# Patient Record
Sex: Female | Born: 1965 | Race: White | Hispanic: No | Marital: Single | Smoking: Never smoker
Health system: Southern US, Community
[De-identification: ages and names within clinical notes are randomized; demographics above are authoritative.]

## PROBLEM LIST (undated history)

## (undated) DIAGNOSIS — N189 Chronic kidney disease, unspecified: Secondary | ICD-10-CM

## (undated) DIAGNOSIS — M3481 Systemic sclerosis with lung involvement: Secondary | ICD-10-CM

## (undated) DIAGNOSIS — I279 Pulmonary heart disease, unspecified: Secondary | ICD-10-CM

## (undated) DIAGNOSIS — F429 Obsessive-compulsive disorder, unspecified: Secondary | ICD-10-CM

## (undated) DIAGNOSIS — N179 Acute kidney failure, unspecified: Secondary | ICD-10-CM

## (undated) DIAGNOSIS — M349 Systemic sclerosis, unspecified: Secondary | ICD-10-CM

## (undated) DIAGNOSIS — M810 Age-related osteoporosis without current pathological fracture: Secondary | ICD-10-CM

## (undated) DIAGNOSIS — I7300 Raynaud's syndrome without gangrene: Secondary | ICD-10-CM

## (undated) DIAGNOSIS — K224 Dyskinesia of esophagus: Secondary | ICD-10-CM

## (undated) HISTORY — DX: Raynaud's syndrome without gangrene: I73.00

## (undated) HISTORY — DX: Age-related osteoporosis without current pathological fracture: M81.0

## (undated) HISTORY — DX: Obsessive-compulsive disorder, unspecified: F42.9

## (undated) HISTORY — DX: Systemic sclerosis, unspecified: M34.9

## (undated) HISTORY — DX: Chronic kidney disease, unspecified: N18.9

## (undated) HISTORY — DX: Pulmonary heart disease, unspecified: I27.9

## (undated) HISTORY — DX: Dyskinesia of esophagus: K22.4

## (undated) HISTORY — DX: Systemic sclerosis with lung involvement: M34.81

## (undated) HISTORY — DX: Acute kidney failure, unspecified: N17.9

---

## 2009-01-12 ENCOUNTER — Ambulatory Visit: Payer: Self-pay | Admitting: Internal Medicine

## 2009-07-04 ENCOUNTER — Ambulatory Visit: Payer: Self-pay | Admitting: Internal Medicine

## 2013-01-22 ENCOUNTER — Ambulatory Visit: Payer: Self-pay | Admitting: Internal Medicine

## 2013-05-10 ENCOUNTER — Ambulatory Visit: Payer: Self-pay | Admitting: Family Medicine

## 2013-05-10 LAB — RAPID STREP-A WITH REFLX: Micro Text Report: NEGATIVE

## 2013-05-12 LAB — BETA STREP CULTURE(ARMC)

## 2014-01-21 ENCOUNTER — Ambulatory Visit: Payer: Self-pay | Admitting: Internal Medicine

## 2014-04-05 DIAGNOSIS — R918 Other nonspecific abnormal finding of lung field: Secondary | ICD-10-CM | POA: Insufficient documentation

## 2014-08-20 ENCOUNTER — Other Ambulatory Visit: Payer: Self-pay | Admitting: Internal Medicine

## 2014-08-20 ENCOUNTER — Ambulatory Visit: Payer: 59 | Attending: Internal Medicine

## 2014-08-20 ENCOUNTER — Other Ambulatory Visit: Payer: 59 | Admitting: Internal Medicine

## 2014-08-20 DIAGNOSIS — J8417 Other interstitial pulmonary diseases with fibrosis in diseases classified elsewhere: Secondary | ICD-10-CM | POA: Diagnosis not present

## 2014-08-20 DIAGNOSIS — J8410 Pulmonary fibrosis, unspecified: Secondary | ICD-10-CM

## 2014-10-31 DIAGNOSIS — Z0000 Encounter for general adult medical examination without abnormal findings: Secondary | ICD-10-CM | POA: Insufficient documentation

## 2014-11-03 DIAGNOSIS — Z789 Other specified health status: Secondary | ICD-10-CM | POA: Insufficient documentation

## 2014-11-03 DIAGNOSIS — F1090 Alcohol use, unspecified, uncomplicated: Secondary | ICD-10-CM | POA: Insufficient documentation

## 2015-11-12 DIAGNOSIS — M810 Age-related osteoporosis without current pathological fracture: Secondary | ICD-10-CM | POA: Insufficient documentation

## 2016-08-24 DIAGNOSIS — F422 Mixed obsessional thoughts and acts: Secondary | ICD-10-CM | POA: Insufficient documentation

## 2016-08-24 DIAGNOSIS — R002 Palpitations: Secondary | ICD-10-CM | POA: Insufficient documentation

## 2016-11-24 DIAGNOSIS — I2721 Secondary pulmonary arterial hypertension: Secondary | ICD-10-CM | POA: Insufficient documentation

## 2018-02-21 ENCOUNTER — Encounter: Payer: Self-pay | Admitting: Psychiatry

## 2018-02-21 ENCOUNTER — Ambulatory Visit (INDEPENDENT_AMBULATORY_CARE_PROVIDER_SITE_OTHER): Payer: 59 | Admitting: Psychiatry

## 2018-02-21 ENCOUNTER — Other Ambulatory Visit: Payer: Self-pay

## 2018-02-21 VITALS — BP 102/69 | HR 97 | Temp 97.4°F | Ht 63.19 in | Wt 166.4 lb

## 2018-02-21 DIAGNOSIS — F429 Obsessive-compulsive disorder, unspecified: Secondary | ICD-10-CM | POA: Diagnosis not present

## 2018-02-21 DIAGNOSIS — F330 Major depressive disorder, recurrent, mild: Secondary | ICD-10-CM

## 2018-02-21 DIAGNOSIS — F410 Panic disorder [episodic paroxysmal anxiety] without agoraphobia: Secondary | ICD-10-CM

## 2018-02-21 DIAGNOSIS — F1020 Alcohol dependence, uncomplicated: Secondary | ICD-10-CM

## 2018-02-21 DIAGNOSIS — M340 Progressive systemic sclerosis: Secondary | ICD-10-CM | POA: Insufficient documentation

## 2018-02-21 DIAGNOSIS — I159 Secondary hypertension, unspecified: Secondary | ICD-10-CM | POA: Insufficient documentation

## 2018-02-21 MED ORDER — VENLAFAXINE HCL ER 37.5 MG PO CP24: 37.5000 mg | ORAL_CAPSULE | Freq: Every day | ORAL | 1 refills | Status: DC

## 2018-02-21 MED ORDER — GABAPENTIN 100 MG PO CAPS: 200.0000 mg | ORAL_CAPSULE | Freq: Every day | ORAL | 0 refills | Status: DC

## 2018-02-21 NOTE — Progress Notes (Signed)
Psychiatric Initial Adult Assessment   Patient Identification: Janice Mayo MRN:  [id number] Date of Evaluation:  02/21/2018 Referral Source:Mark Heffington MD Chief Complaint:  ' I am here to establish care.' Chief Complaint    Establish Care; Anxiety; Depression; Panic Attack; Alcohol Problem     Visit Diagnosis:    ICD-10-CM   1. Obsessive-compulsive disorder with good or fair insight F42.9 venlafaxine XR (EFFEXOR-XR) 37.5 MG 24 hr capsule  2. Panic disorder F41.0   3. MDD (major depressive disorder), recurrent episode, mild (HCC) F33.0   4. Alcohol use disorder, moderate, dependence (HCC) F10.20     History of Present Illness:  Janice FailWendy is a 52 year old Caucasian female, single, on FMLA, lives in [place], has a history of OCD, MDD, panic attacks, alcohol use disorder, scleroderma, presented to the clinic today to establish care.  Patient reports she has been struggling with depression since the past several years.  Patient describes her depressive symptoms as sadness, lack of motivation, anhedonia, crying spells, sleep problems and so on.  Patient reports that her depressive symptoms are currently more so under control since she is on venlafaxine.  Venlafaxine was started a few weeks ago by her primary provider.  Patient reports a history of OCD.  She reports she has checking behaviors.  She reports her checking behavior used to be significant in the past.  She would spend hours checking her stove , water faucets, locks and so on.  Patient reports she used to be on Prozac in the past.  She was on Prozac since 2001 however most recently it was discontinued and she was started on venlafaxine.  Patient reports it has helped her and her symptoms are currently improving.  Patient reports she was also struggling with panic attacks on a regular basis.  Patient reports  panic attacks where significant to the point that she was having racing heart rate, chest pain, shortness of breath.  She  reports she was also having trouble driving since she would have panic attacks while driving.  Patient reports her provider recently made a change to venlafaxine and also gave her Klonopin which has helped her.  Patient reports she also struggles with social anxiety on a regular basis.  She likes to stay away from crowds and public places.  Patient reports she has always been that way and was known as a shy child and did not have a lot of friends.  Patient reports she currently sees a psychotherapist-Dr. Marcell AngerJudy Butler who is providing her CBT.  She sees her every week.  Patient struggles with alcoholism.  She reports she used to drink heavily in the past.  She reports she would drink 1/5 of liquor every day in the past.  She reports she is trying to cut down and since the past few weeks has been drinking up to 4 shots per day.  She reports she would like to cut down more however is worried about her having withdrawal symptoms like tremors, DTs, seizures and so on.  Patient reports she never had withdrawal symptoms in the past but she has been doing some reading about alcoholism and that worries her.  Patient reports she is interested in going to an outpatient program to get help with her alcoholism.  Patient reports she struggles with scleroderma.  Patient reports she was initially diagnosed in 2006.  At that time she went into renal failure and that is when she was diagnosed with scleroderma.  Patient reports she currently follows up with her  providers and is on medications for the same.  Patient reports she is currently on FMLA from her work since she was having a lot of stress from her mental health as well as medical problems.  Her provider hence took her off of work and she has 3 more weeks available.    Associated Signs/Symptoms: Depression Symptoms:  depressed mood, insomnia, difficulty concentrating, anxiety, panic attacks, decreased appetite,- reports she is making progress (Hypo) Manic  Symptoms:  denies Anxiety Symptoms:  Panic Symptoms, Obsessive Compulsive Symptoms:   Checking,, Social Anxiety, Psychotic Symptoms:  denies PTSD Symptoms: Negative  Past Psychiatric History: Patient denies any inpatient mental health admissions in the past.  Patient denies any suicide attempts.  Patient reports a previous diagnosis of depression, panic attacks, OCD.  She was being treated by her primary medical doctor.  Patient also sees a therapist -Dr. Marcell Anger in [place].  Previous Psychotropic Medications: Yes Prozac, Klonopin, Ativan, venlafaxine  Substance Abuse History in the last 12 months:  Yes.  Patient has been drinking alcohol since the past 10 years or more.  She reports she was drinking up to 1/5 of liquor every day.  Patient currently trying to cut down and reports she has been drinking up to 4 shots of liquor per day.  Patient is interested in intensive outpatient program.  Patient denies being in residential treatment facilities in the past  Consequences of Substance Abuse: Negative  Past Medical History:  Past Medical History:  Diagnosis Date  . Acute renal failure syndrome (HCC)   . Chronic kidney disease   . Chronic pulmonary heart disease (HCC)   . Diffuse spasm of esophagus   . Lung disease with systemic sclerosis (HCC)   . Obsessive-compulsive disorder   . Osteoporosis   . Raynaud's disease   . Systemic scleroses (HCC)    History reviewed. No pertinent surgical history.  Family Psychiatric History: Brother-anxiety, sister-panic attacks.  Family History:  Family History  Problem Relation Age of Onset  . Anxiety disorder Sister   . Anxiety disorder Brother     Social History:   Social History   Socioeconomic History  . Marital status: Single    Spouse name: Not on file  . Number of children: 0  . Years of education: Not on file  . Highest education level: High school graduate  Occupational History  . Not on file  Social Needs  . Financial  resource strain: Not hard at all  . Food insecurity:    Worry: Never true    Inability: Never true  . Transportation needs:    Medical: No    Non-medical: No  Tobacco Use  . Smoking status: Never Smoker  . Smokeless tobacco: Never Used  Substance and Sexual Activity  . Alcohol use: Yes    Alcohol/week: 20.0 standard drinks    Types: 20 Shots of liquor per week  . Drug use: Not Currently  . Sexual activity: Not Currently  Lifestyle  . Physical activity:    Days per week: 0 days    Minutes per session: 0 min  . Stress: Not on file  Relationships  . Social connections:    Talks on phone: Not on file    Gets together: Not on file    Attends religious service: Never    Active member of club or organization: Yes    Attends meetings of clubs or organizations: More than 4 times per year    Relationship status: Never married  Other Topics Concern  .  Not on file  Social History Narrative  . Not on file    Additional Social History: Patient is single.  She reports she had a normal childhood.  She currently lives by herself in [place].  Patient is currently on FMLA from her work.  Allergies:   Allergies  Allergen Reactions  . Sulfasalazine Hives  . Erythromycin Hives  . Hydroxychloroquine Hives  . Septra [Sulfamethoxazole-Trimethoprim] Hives  . Sulfa Antibiotics Hives  . Tetracyclines & Related Hives  . Trimethoprim Other (See Comments)  . Simvastatin Rash    Metabolic Disorder Labs: No results found for: HGBA1C, MPG No results found for: PROLACTIN No results found for: CHOL, TRIG, HDL, CHOLHDL, VLDL, LDLCALC No results found for: TSH  Therapeutic Level Labs: No results found for: LITHIUM No results found for: CBMZ No results found for: VALPROATE  Current Medications: Current Outpatient Medications  Medication Sig Dispense Refill  . captopril (CAPOTEN) 100 MG tablet Take by mouth.    . Cholecalciferol (VITAMIN D-1000 MAX ST) 25 MCG (1000 UT) tablet Take by mouth.     . clonazePAM (KLONOPIN) 1 MG tablet take 1 Tablet by oral route every 8 hours    . denosumab (PROLIA) 60 MG/ML SOSY injection every 6 (six) months    . Multiple Vitamins-Minerals (CENTRUM FLAVOR BURST ADULT) CHEW Chew by mouth.    . mycophenolate (CELLCEPT) 500 MG tablet Take by mouth.    Marland Kitchen NIFEdipine (PROCARDIA XL/NIFEDICAL XL) 60 MG 24 hr tablet Take by mouth.    Marland Kitchen omeprazole (PRILOSEC) 20 MG capsule Take by mouth.    . Venlafaxine HCl 150 MG TB24 Take by mouth.    . gabapentin (NEURONTIN) 100 MG capsule Take 2 capsules (200 mg total) by mouth at bedtime. 60 capsule 0  . venlafaxine XR (EFFEXOR-XR) 37.5 MG 24 hr capsule Take 1 capsule (37.5 mg total) by mouth daily with breakfast. To be combined to 150 mg 30 capsule 1   No current facility-administered medications for this visit.     Musculoskeletal: Strength & Muscle Tone: within normal limits Gait & Station: normal Patient leans: N/A  Psychiatric Specialty Exam: Review of Systems  Psychiatric/Behavioral: Positive for depression. The patient is nervous/anxious and has insomnia.   All other systems reviewed and are negative.   Blood pressure 102/69, pulse 97, temperature (!) 97.4 F (36.3 C), temperature source Oral, height 5' 3.19" (1.605 m), weight 166 lb 6.4 oz (75.5 kg).Body mass index is 29.3 kg/m.  General Appearance: Casual  Eye Contact:  Fair  Speech:  Clear and Coherent  Volume:  Normal  Mood:  Anxious and Depressed  Affect:  Congruent  Thought Process:  Goal Directed and Descriptions of Associations: Intact  Orientation:  Full (Time, Place, and Person)  Thought Content:  Logical  Suicidal Thoughts:  No  Homicidal Thoughts:  No  Memory:  Immediate;   Fair Recent;   Fair Remote;   Fair  Judgement:  Fair  Insight:  Fair  Psychomotor Activity:  Normal  Concentration:  Concentration: Fair and Attention Span: Fair  Recall:  [employer] of Knowledge:Fair  Language: Fair  Akathisia:  No  Handed:  Right  AIMS (if  indicated):0  Assets:  Communication Skills Desire for Improvement Housing Social Support Talents/Skills Transportation Vocational/Educational  ADL's:  Intact  Cognition: WNL  Sleep:  Poor   Screenings:   Assessment and Plan: Arlyne is a 52 year old Caucasian female, single, has a history of OCD, panic attacks, MDD, social anxiety disorder, alcohol use disorder, scleroderma,  pulmonary hypertension, presented to the clinic today to establish care.  Patient is biologically predisposed given her alcoholism as well as family history of mental health problems and her medical problems.  Patient also has psychosocial stressors of her chronic medical illness.  Patient denies any suicidality.  She is motivated to stay on medications as well as pursue psychotherapy.  Plan as noted below.  Plan OCD Increase venlafaxine to 187.5 mg p.o. Daily Continue Klonopin-however discussed with her to take 0.5 mg in the morning and 0.5 mg a day.  She was taking 1.5 daily in divided dosage. I have reviewed [place] controlled substance database.  Discussed with patient the risk of being on long-term benzodiazepine therapy as well as the risk of mixing it with alcohol.  Patient reports she is trying to cut down on her drinking.  For MDD Increase venlafaxine to 187.5 mg p.o. daily Continue CBT with Dr. Marcell Anger in [place].  For panic attacks Venlafaxine as prescribed Continue CBT Continue Klonopin as prescribed.  For alcohol use disorder Will refer patient to [facility] program in [place].  Also provided patient with information for other substance abuse treatment programs like [facility], [facility], [facility] and so on. We will start gabapentin 200 mg p.o. Nightly.  I have reviewed TSH-in [facility]-the normal limits on 08/24/2016.  Follow-up in clinic in 1 week or sooner if needed.  More than 50 % of the time was spent for psychoeducation and supportive psychotherapy and care coordination.  This  note was generated in part or whole with voice recognition software. Voice recognition is usually quite accurate but there are transcription errors that can and very often do occur. I apologize for any typographical errors that were not detected and corrected.            Jomarie Longs, MD 11/20/20194:20 PM

## 2018-02-21 NOTE — Patient Instructions (Signed)
Please start taking Half of 1 mg ( that is 0.5 mg)  of Klonopin daily in the AM and continue your mid day dosage as it is until you return.

## 2018-02-23 ENCOUNTER — Telehealth (HOSPITAL_COMMUNITY): Payer: Self-pay | Admitting: Psychology

## 2018-02-23 ENCOUNTER — Telehealth: Payer: Self-pay | Admitting: Psychiatry

## 2018-02-23 NOTE — Telephone Encounter (Signed)
Called Ms.Charmian MuffAnn Evans with [facility] for update on patient referral. She will call her today and will ask her to come in Monday for IOP.

## 2018-02-27 ENCOUNTER — Telehealth (HOSPITAL_COMMUNITY): Payer: Self-pay | Admitting: Psychology

## 2018-02-28 ENCOUNTER — Other Ambulatory Visit: Payer: Self-pay

## 2018-02-28 ENCOUNTER — Ambulatory Visit (INDEPENDENT_AMBULATORY_CARE_PROVIDER_SITE_OTHER): Payer: 59 | Admitting: Psychiatry

## 2018-02-28 ENCOUNTER — Encounter: Payer: Self-pay | Admitting: Psychiatry

## 2018-02-28 VITALS — BP 115/79 | HR 122 | Temp 98.7°F | Wt 165.4 lb

## 2018-02-28 DIAGNOSIS — N179 Acute kidney failure, unspecified: Secondary | ICD-10-CM | POA: Insufficient documentation

## 2018-02-28 DIAGNOSIS — Z1231 Encounter for screening mammogram for malignant neoplasm of breast: Secondary | ICD-10-CM | POA: Insufficient documentation

## 2018-02-28 DIAGNOSIS — F3289 Other specified depressive episodes: Secondary | ICD-10-CM | POA: Insufficient documentation

## 2018-02-28 DIAGNOSIS — F1020 Alcohol dependence, uncomplicated: Secondary | ICD-10-CM

## 2018-02-28 DIAGNOSIS — R091 Pleurisy: Secondary | ICD-10-CM | POA: Insufficient documentation

## 2018-02-28 DIAGNOSIS — Z79899 Other long term (current) drug therapy: Secondary | ICD-10-CM | POA: Insufficient documentation

## 2018-02-28 DIAGNOSIS — I2789 Other specified pulmonary heart diseases: Secondary | ICD-10-CM | POA: Insufficient documentation

## 2018-02-28 DIAGNOSIS — E038 Other specified hypothyroidism: Secondary | ICD-10-CM | POA: Insufficient documentation

## 2018-02-28 DIAGNOSIS — K224 Dyskinesia of esophagus: Secondary | ICD-10-CM | POA: Insufficient documentation

## 2018-02-28 DIAGNOSIS — F330 Major depressive disorder, recurrent, mild: Secondary | ICD-10-CM

## 2018-02-28 DIAGNOSIS — F410 Panic disorder [episodic paroxysmal anxiety] without agoraphobia: Secondary | ICD-10-CM | POA: Diagnosis not present

## 2018-02-28 DIAGNOSIS — F429 Obsessive-compulsive disorder, unspecified: Secondary | ICD-10-CM | POA: Diagnosis not present

## 2018-02-28 DIAGNOSIS — I7300 Raynaud's syndrome without gangrene: Secondary | ICD-10-CM | POA: Insufficient documentation

## 2018-02-28 DIAGNOSIS — I2729 Other secondary pulmonary hypertension: Secondary | ICD-10-CM | POA: Insufficient documentation

## 2018-02-28 DIAGNOSIS — T148XXA Other injury of unspecified body region, initial encounter: Secondary | ICD-10-CM | POA: Insufficient documentation

## 2018-02-28 DIAGNOSIS — J8410 Pulmonary fibrosis, unspecified: Secondary | ICD-10-CM | POA: Insufficient documentation

## 2018-02-28 DIAGNOSIS — M810 Age-related osteoporosis without current pathological fracture: Secondary | ICD-10-CM | POA: Insufficient documentation

## 2018-02-28 MED ORDER — GABAPENTIN 100 MG PO CAPS: 200.0000 mg | ORAL_CAPSULE | Freq: Every day | ORAL | 1 refills | Status: DC

## 2018-02-28 MED ORDER — TRAZODONE HCL 50 MG PO TABS: 25.0000 mg | ORAL_TABLET | Freq: Every day | ORAL | 1 refills | Status: AC

## 2018-02-28 NOTE — Progress Notes (Signed)
[facility] MD  OP Progress Note  02/28/2018 2:27 PM Janice Mayo  MRN:  [id number]  Chief Complaint: ' I am here for follow up." Chief Complaint    Follow-up; Medication Refill     HPI: Janice Mayo is a 52 year old Caucasian female, single on FMLA, lives in [place], has a history of OCD, MDD, panic attacks, alcohol use disorder, scleroderma, presented to the clinic today for a follow-up visit.  Patient today reports that she has been making some progress with regards to her mood symptoms.  She reports her sadness as improved and she feels more motivated .  She is tolerating the Effexor well at the higher dosage.  Patient reports she has been better able to cope with her obsessions and compulsions.   She continues to cut down on her alcohol use.  She is down to 3 drinks per day now.  She reports she continues to want to cut down.  She was referred to [facility] with [place].  She spoke to Janice Mayo referred her for hospital detox as well as groups.  Patient however did not want to do it due to the distance and did not want to drive that far.  Patient reports she has been going for [employer] which helps.  She wants to continue to do that.  She is taking gabapentin at bedtime and she wants to continue the same.  She does not want any other medication for alcohol use disorder like naltrexone or Campral at this time.  Patient denies any suicidality.  Patient denies any perceptual disturbances.  She reports she does have increased heart rate often due to her scleroderma.  She continues to follow-up with her providers for the same.  Currently in psychotherapy with Janice Mayo which is going well. Visit Diagnosis:    ICD-10-CM   1. Obsessive-compulsive disorder with good or fair insight F42.9 gabapentin (NEURONTIN) 100 MG capsule    traZODone (DESYREL) 50 MG tablet  2. Panic disorder F41.0 gabapentin (NEURONTIN) 100 MG capsule    traZODone (DESYREL) 50 MG tablet  3. MDD (major depressive disorder),  recurrent episode, mild ([place]) F33.0   4. Alcohol use disorder, moderate, dependence (HCC) F10.20 gabapentin (NEURONTIN) 100 MG capsule    Past Psychiatric History: Have reviewed past psychiatric history from my progress note on 02/21/2018.  Past trials of Prozac, Klonopin, Ativan, venlafaxine  Past Medical History:  Past Medical History:  Diagnosis Date  . Acute renal failure syndrome ([place])   . Chronic kidney disease   . Chronic pulmonary heart disease ([place])   . Diffuse spasm of esophagus   . Lung disease with systemic sclerosis ([place])   . Obsessive-compulsive disorder   . Osteoporosis   . Raynaud's disease   . Systemic scleroses ([place])    History reviewed. No pertinent surgical history.  Family Psychiatric History: Reviewed family psychiatric history from my progress note on 02/21/2018  Family History:  Family History  Problem Relation Age of Onset  . Anxiety disorder Sister   . Anxiety disorder Brother    Substance abuse history: Patient has a history of heavy alcohol abuse since the past 10 years or more.  She has been cutting down on her drinks and is currently drinking up to 3 drinks per day.  Patient is not interested in [facility] or detox programs at this time.  She was given the information for the same.  She is currently in [employer].   Social History: I have reviewed social history  from my progress note on 02/21/2018 Social History   Socioeconomic History  . Marital status: Single    Spouse name: Not on file  . Number of children: 0  . Years of education: Not on file  . Highest education level: High school graduate  Occupational History  . Not on file  Social Needs  . Financial resource strain: Not hard at all  . Food insecurity:    Worry: Never true    Inability: Never true  . Transportation needs:    Medical: No    Non-medical: No  Tobacco Use  . Smoking status: Never Smoker  . Smokeless tobacco: Never Used  Substance and Sexual Activity  . Alcohol use:  Yes    Alcohol/week: 20.0 standard drinks    Types: 20 Shots of liquor per week  . Drug use: Not Currently  . Sexual activity: Not Currently  Lifestyle  . Physical activity:    Days per week: 0 days    Minutes per session: 0 min  . Stress: Not on file  Relationships  . Social connections:    Talks on phone: Not on file    Gets together: Not on file    Attends religious service: Never    Active member of club or organization: Yes    Attends meetings of clubs or organizations: More than 4 times per year    Relationship status: Never married  Other Topics Concern  . Not on file  Social History Narrative  . Not on file    Allergies:  Allergies  Allergen Reactions  . Sulfasalazine Hives  . Erythromycin Hives  . Hydroxychloroquine Hives  . Septra [Sulfamethoxazole-Trimethoprim] Hives  . Sulfa Antibiotics Hives  . Tetracyclines & Related Hives  . Trimethoprim Other (See Comments)  . Simvastatin Rash    Metabolic Disorder Labs: No results found for: HGBA1C, MPG No results found for: PROLACTIN No results found for: CHOL, TRIG, HDL, CHOLHDL, VLDL, LDLCALC No results found for: TSH  Therapeutic Level Labs: No results found for: LITHIUM No results found for: VALPROATE No components found for:  CBMZ  Current Medications: Current Outpatient Medications  Medication Sig Dispense Refill  . captopril (CAPOTEN) 100 MG tablet Take by mouth.    . Cholecalciferol (VITAMIN D-1000 MAX ST) 25 MCG (1000 UT) tablet Take by mouth.    . clonazePAM (KLONOPIN) 1 MG tablet take 1 Tablet by oral route every 8 hours    . denosumab (PROLIA) 60 MG/ML SOSY injection every 6 (six) months    . gabapentin (NEURONTIN) 100 MG capsule Take 2 capsules (200 mg total) by mouth at bedtime. 60 capsule 1  . Multiple Vitamins-Minerals (CENTRUM FLAVOR BURST ADULT) CHEW Chew by mouth.    . mycophenolate (CELLCEPT) 500 MG tablet Take by mouth.    Marland Kitchen NIFEdipine (PROCARDIA XL/NIFEDICAL XL) 60 MG 24 hr tablet Take  by mouth.    Marland Kitchen omeprazole (PRILOSEC) 20 MG capsule Take by mouth.    . venlafaxine XR (EFFEXOR-XR) 37.5 MG 24 hr capsule Take 1 capsule (37.5 mg total) by mouth daily with breakfast. To be combined to 150 mg 30 capsule 1  . traZODone (DESYREL) 50 MG tablet Take 0.5-1 tablets (25-50 mg total) by mouth at bedtime. For sleep 30 tablet 1   No current facility-administered medications for this visit.      Musculoskeletal: Strength & Muscle Tone: within normal limits Gait & Station: normal Patient leans: N/A  Psychiatric Specialty Exam: Review of Systems  Psychiatric/Behavioral: The patient is nervous/anxious  and has insomnia.   All other systems reviewed and are negative.   Blood pressure 115/79, pulse (!) 122, temperature 98.7 F (37.1 C), temperature source Oral, weight 165 lb 6.4 oz (75 kg).Body mass index is 29.12 kg/m.  General Appearance: Casual  Eye Contact:  Good  Speech:  Normal Rate  Volume:  Decreased  Mood:  Anxious improving  Affect:  Congruent  Thought Process:  Goal Directed and Descriptions of Associations: Intact  Orientation:  Full (Time, Place, and Person)  Thought Content: Logical   Suicidal Thoughts:  No  Homicidal Thoughts:  No  Memory:  Immediate;   Fair Recent;   Fair Remote;   Fair  Judgement:  Fair  Insight:  Fair  Psychomotor Activity:  Normal  Concentration:  Concentration: Fair and Attention Span: Fair  Recall:  [employer] of Knowledge: Fair  Language: Fair  Akathisia:  No  Handed:  Right  AIMS (if indicated): denies tremors, rigidity,stiffnes  Assets:  Communication Skills Desire for Improvement Social Support  ADL's:  Intact  Cognition: WNL  Sleep:  Poor   Screenings:   Assessment and Plan: Suman is a 52 year old Caucasian female, single, has a history of OCD, panic attacks, MDD, social anxiety disorder, alcohol use disorder, scleroderma, pulmonary hypertension, presented to the clinic today for a follow-up visit.  Patient is  biologically predisposed given her alcoholism as well as family history of mental health problems.  Patient also has psychosocial stressors of her chronic medical illness.  Patient is currently making progress on the current medications.  She will continue AA meetings.  She does struggle with sleep problems and hence will make the following met changes.  Plan For OCD Venlafaxine 187.5 mg p.o. daily Discussed with patient to continue to wean off Klonopin.  She is currently taking 0.5 mg twice a day, discussed with her to take 0.25 mg at least 2 days a week-Tuesday and Thursday a.m.    For MDD Venlafaxine as prescribed Continue CBT with Dr. Genene Mayo in [place].  For panic attacks Continue CBT.  For alcohol use disorder Patient to continue AA meetings Gabapentin 200 mg p.o. Nightly  For insomnia Start trazodone 25-50 mg p.o. 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  Follow-up in clinic in 2-3 weeks or sooner if needed.  More than 50 % of the time was spent for psychoeducation and supportive psychotherapy and care coordination.  This note was generated in part or whole with voice recognition software. Voice recognition is usually quite accurate but there are transcription errors that can and very often do occur. I apologize for any typographical errors that were not detected and corrected.       Ursula Alert, MD 02/28/2018, 2:27 PM

## 2018-02-28 NOTE — Patient Instructions (Signed)
Trazodone tablets What is this medicine? TRAZODONE (TRAZ oh done) is used to treat depression. This medicine may be used for other purposes; ask your health care provider or pharmacist if you have questions. COMMON BRAND NAME(S): Desyrel What should I tell my health care provider before I take this medicine? They need to know if you have any of these conditions: -attempted suicide or thinking about it -bipolar disorder -bleeding problems -glaucoma -heart disease, or previous heart attack -irregular heart beat -kidney or liver disease -low levels of sodium in the blood -an unusual or allergic reaction to trazodone, other medicines, foods, dyes or preservatives -pregnant or trying to get pregnant -breast-feeding How should I use this medicine? Take this medicine by mouth with a glass of water. Follow the directions on the prescription label. Take this medicine shortly after a meal or a light snack. Take your medicine at regular intervals. Do not take your medicine more often than directed. Do not stop taking this medicine suddenly except upon the advice of your doctor. Stopping this medicine too quickly may cause serious side effects or your condition may worsen. A special MedGuide will be given to you by the pharmacist with each prescription and refill. Be sure to read this information carefully each time. Talk to your pediatrician regarding the use of this medicine in children. Special care may be needed. Overdosage: If you think you have taken too much of this medicine contact a poison control center or emergency room at once. NOTE: This medicine is only for you. Do not share this medicine with others. What if I miss a dose? If you miss a dose, take it as soon as you can. If it is almost time for your next dose, take only that dose. Do not take double or extra doses. What may interact with this medicine? Do not take this medicine with any of the following  medications: -certain medicines for fungal infections like fluconazole, itraconazole, ketoconazole, posaconazole, voriconazole -cisapride -dofetilide -dronedarone -linezolid -MAOIs like Carbex, Eldepryl, Marplan, Nardil, and Parnate -mesoridazine -methylene blue (injected into a vein) -pimozide -saquinavir -thioridazine -ziprasidone This medicine may also interact with the following medications: -alcohol -antiviral medicines for HIV or AIDS -aspirin and aspirin-like medicines -barbiturates like phenobarbital -certain medicines for blood pressure, heart disease, irregular heart beat -certain medicines for depression, anxiety, or psychotic disturbances -certain medicines for migraine headache like almotriptan, eletriptan, frovatriptan, naratriptan, rizatriptan, sumatriptan, zolmitriptan -certain medicines for seizures like carbamazepine and phenytoin -certain medicines for sleep -certain medicines that treat or prevent blood clots like dalteparin, enoxaparin, warfarin -digoxin -fentanyl -lithium -NSAIDS, medicines for pain and inflammation, like ibuprofen or naproxen -other medicines that prolong the QT interval (cause an abnormal heart rhythm) -rasagiline -supplements like St. John's wort, kava kava, valerian -tramadol -tryptophan This list may not describe all possible interactions. Give your health care provider a list of all the medicines, herbs, non-prescription drugs, or dietary supplements you use. Also tell them if you smoke, drink alcohol, or use illegal drugs. Some items may interact with your medicine. What should I watch for while using this medicine? Tell your doctor if your symptoms do not get better or if they get worse. Visit your doctor or health care professional for regular checks on your progress. Because it may take several weeks to see the full effects of this medicine, it is important to continue your treatment as prescribed by your doctor. Patients and their  families should watch out for  new or worsening thoughts of suicide or depression. Also watch out for sudden changes in feelings such as feeling anxious, agitated, panicky, irritable, hostile, aggressive, impulsive, severely restless, overly excited and hyperactive, or not being able to sleep. If this happens, especially at the beginning of treatment or after a change in dose, call your health care professional. Bonita QuinYou may get drowsy or dizzy. Do not drive, use machinery, or do anything that needs mental alertness until you know how this medicine affects you. Do not stand or sit up quickly, especially if you are an older patient. This reduces the risk of dizzy or fainting spells. Alcohol may interfere with the effect of this medicine. Avoid alcoholic drinks. This medicine may cause dry eyes and blurred vision. If you wear contact lenses you may feel some discomfort. Lubricating drops may help. See your eye doctor if the problem does not go away or is severe. Your mouth may get dry. Chewing sugarless gum, sucking hard candy and drinking plenty of water may help. Contact your doctor if the problem does not go away or is severe. What side effects may I notice from receiving this medicine? Side effects that you should report to your doctor or health care professional as soon as possible: -allergic reactions like skin rash, itching or hives, swelling of the face, lips, or tongue -elevated mood, decreased need for sleep, racing thoughts, impulsive behavior -confusion -fast, irregular heartbeat -feeling faint or lightheaded, falls -feeling agitated, angry, or irritable -loss of balance or coordination -painful or prolonged erections -restlessness, pacing, inability to keep still -suicidal thoughts or other mood changes -tremors -trouble sleeping -seizures -unusual bleeding or bruising Side effects that usually do not require medical attention (report to your doctor or health care professional if they  continue or are bothersome): -change in sex drive or performance -change in appetite or weight -constipation -headache -muscle aches or pains -nausea This list may not describe all possible side effects. Call your doctor for medical advice about side effects. You may report side effects to FDA at 1-800-FDA-1088. Where should I keep my medicine? Keep out of the reach of children. Store at room temperature between 15 and 30 degrees C (59 to 86 degrees F). Protect from light. Keep container tightly closed. Throw away any unused medicine after the expiration date. NOTE: This sheet is a summary. It may not cover all possible information. If you have questions about this medicine, talk to your doctor, pharmacist, or health care provider.  2018 Elsevier/Gold Standard (2015-08-20 16:57:05)  Continue Klonopin 0.5 mg twice a day every day except Tuesday and Thursday AM when you will take 1/4 th ( 0.25 mg ) tablet .

## 2018-03-14 ENCOUNTER — Ambulatory Visit: Payer: 59 | Admitting: Psychiatry

## 2018-03-22 ENCOUNTER — Encounter: Payer: Self-pay | Admitting: Psychiatry

## 2018-03-22 ENCOUNTER — Other Ambulatory Visit: Payer: Self-pay

## 2018-03-22 ENCOUNTER — Ambulatory Visit (INDEPENDENT_AMBULATORY_CARE_PROVIDER_SITE_OTHER): Payer: 59 | Admitting: Psychiatry

## 2018-03-22 VITALS — BP 122/87 | HR 112 | Temp 97.7°F | Wt 166.2 lb

## 2018-03-22 DIAGNOSIS — F410 Panic disorder [episodic paroxysmal anxiety] without agoraphobia: Secondary | ICD-10-CM

## 2018-03-22 DIAGNOSIS — F429 Obsessive-compulsive disorder, unspecified: Secondary | ICD-10-CM | POA: Diagnosis not present

## 2018-03-22 DIAGNOSIS — F1020 Alcohol dependence, uncomplicated: Secondary | ICD-10-CM | POA: Diagnosis not present

## 2018-03-22 DIAGNOSIS — F330 Major depressive disorder, recurrent, mild: Secondary | ICD-10-CM | POA: Diagnosis not present

## 2018-03-22 MED ORDER — GABAPENTIN 100 MG PO CAPS: 200.0000 mg | ORAL_CAPSULE | Freq: Every day | ORAL | 1 refills | Status: DC

## 2018-03-22 MED ORDER — VENLAFAXINE HCL ER 37.5 MG PO CP24: 37.5000 mg | ORAL_CAPSULE | Freq: Every day | ORAL | 1 refills | Status: DC

## 2018-03-22 MED ORDER — VENLAFAXINE HCL ER 150 MG PO CP24: 150.0000 mg | ORAL_CAPSULE | Freq: Every day | ORAL | 1 refills | Status: DC

## 2018-03-22 NOTE — Progress Notes (Signed)
BH MD OP Progress Note  03/22/2018 5:31 PM Weber CooksWendy L Weltz  MRN:  [id number]  Chief Complaint: ' I am here for follow up." Chief Complaint    Follow-up; Medication Refill     HPI: [name] is a 52 yr old Caucasian female, single , employed, [place] in [place], has a history of OCD, MDD, panic attacks, alcohol use disorder, scleroderma, presented to the clinic today for a follow-up visit.  Patient reports she is making progress with regards to her mood symptoms.  She reports she feels less anxious and less depressed at this time.  She continues to be compliant with her Effexor.  She is currently on 187.5 mg.  She denies any side effects to the medication.  She continues to take the gabapentin as prescribed.  Patient reports she has been able to cut down on the Klonopin and currently takes 0.25 mg 2 days a week and 0.5 mg the other days.  Patient is willing to wean off more and denies any withdrawal symptoms.  She reports she is currently down to 2 shots of alcohol per day.  She continues to go to [employer] meetings which are helpful.  She is currently back at work and reports work is going well.  She plans to spend her Christmas with her extended family.  She looks forward to that.  She denies any suicidality, homicidality or perceptual disturbances.  Patient continues to be in psychotherapy which is going well. Visit Diagnosis:    ICD-10-CM   1. Obsessive-compulsive disorder with good or fair insight F42.9 venlafaxine XR (EFFEXOR-XR) 150 MG 24 hr capsule    venlafaxine XR (EFFEXOR-XR) 37.5 MG 24 hr capsule    gabapentin (NEURONTIN) 100 MG capsule   improving  2. Panic disorder F41.0 venlafaxine XR (EFFEXOR-XR) 150 MG 24 hr capsule    gabapentin (NEURONTIN) 100 MG capsule   improving  3. MDD (major depressive disorder), recurrent episode, mild (HCC) F33.0    improving  4. Alcohol use disorder, moderate, dependence (HCC) F10.20 gabapentin (NEURONTIN) 100 MG capsule    Past Psychiatric  History: Reviewed past psychiatric history from my progress note on 02/21/2018.  Past trials of Prozac, Klonopin, Ativan, venlafaxine.  Past Medical History:  Past Medical History:  Diagnosis Date  . Acute renal failure syndrome (HCC)   . Chronic kidney disease   . Chronic pulmonary heart disease (HCC)   . Diffuse spasm of esophagus   . Lung disease with systemic sclerosis (HCC)   . Obsessive-compulsive disorder   . Osteoporosis   . Raynaud's disease   . Systemic scleroses (HCC)    History reviewed. No pertinent surgical history.  Family Psychiatric History: Reviewed family psychiatric history from my progress note on 02/21/2018  Family History:  Family History  Problem Relation Age of Onset  . Anxiety disorder Sister   . Anxiety disorder Brother     Social History: I have reviewed social history from my progress note on 02/21/2018 Social History   Socioeconomic History  . Marital status: Single    Spouse name: Not on file  . Number of children: 0  . Years of education: Not on file  . Highest education level: High school graduate  Occupational History  . Not on file  Social Needs  . Financial resource strain: Not hard at all  . Food insecurity:    Worry: Never true    Inability: Never true  . Transportation needs:    Medical: No    Non-medical: No  Tobacco Use  .  Smoking status: Never Smoker  . Smokeless tobacco: Never Used  Substance and Sexual Activity  . Alcohol use: Yes    Alcohol/week: 20.0 standard drinks    Types: 20 Shots of liquor per week  . Drug use: Not Currently  . Sexual activity: Not Currently  Lifestyle  . Physical activity:    Days per week: 0 days    Minutes per session: 0 min  . Stress: Not on file  Relationships  . Social connections:    Talks on phone: Not on file    Gets together: Not on file    Attends religious service: Never    Active member of club or organization: Yes    Attends meetings of clubs or organizations: More than 4  times per year    Relationship status: Never married  Other Topics Concern  . Not on file  Social History Narrative  . Not on file    Allergies:  Allergies  Allergen Reactions  . Sulfasalazine Hives  . Erythromycin Hives  . Hydroxychloroquine Hives  . Septra [Sulfamethoxazole-Trimethoprim] Hives  . Sulfa Antibiotics Hives  . Tetracyclines & Related Hives  . Trimethoprim Other (See Comments)  . Simvastatin Rash    Metabolic Disorder Labs: No results found for: HGBA1C, MPG No results found for: PROLACTIN No results found for: CHOL, TRIG, HDL, CHOLHDL, VLDL, LDLCALC No results found for: TSH  Therapeutic Level Labs: No results found for: LITHIUM No results found for: VALPROATE No components found for:  CBMZ  Current Medications: Current Outpatient Medications  Medication Sig Dispense Refill  . captopril (CAPOTEN) 100 MG tablet Take by mouth.    . Cholecalciferol (VITAMIN D-1000 MAX ST) 25 MCG (1000 UT) tablet Take by mouth.    . clonazePAM (KLONOPIN) 1 MG tablet take 1 Tablet by oral route every 8 hours    . denosumab (PROLIA) 60 MG/ML SOSY injection every 6 (six) months    . gabapentin (NEURONTIN) 100 MG capsule Take 2 capsules (200 mg total) by mouth at bedtime. 60 capsule 1  . Multiple Vitamins-Minerals (CENTRUM FLAVOR BURST ADULT) CHEW Chew by mouth.    . mycophenolate (CELLCEPT) 500 MG tablet Take by mouth.    Marland Kitchen. NIFEdipine (PROCARDIA XL/NIFEDICAL XL) 60 MG 24 hr tablet Take by mouth.    Marland Kitchen. omeprazole (PRILOSEC) 20 MG capsule Take by mouth.    . traZODone (DESYREL) 50 MG tablet Take 0.5-1 tablets (25-50 mg total) by mouth at bedtime. For sleep 30 tablet 1  . venlafaxine XR (EFFEXOR-XR) 37.5 MG 24 hr capsule Take 1 capsule (37.5 mg total) by mouth daily with breakfast. To be combined to 150 mg 30 capsule 1  . venlafaxine XR (EFFEXOR-XR) 150 MG 24 hr capsule Take 1 capsule (150 mg total) by mouth daily with breakfast. To be combined with 37.5 mg 30 capsule 1   No current  facility-administered medications for this visit.      Musculoskeletal: Strength & Muscle Tone: within normal limits Gait & Station: normal Patient leans: N/A  Psychiatric Specialty Exam: Review of Systems  Psychiatric/Behavioral: Positive for substance abuse. The patient is nervous/anxious.   All other systems reviewed and are negative.   Blood pressure 122/87, pulse (!) 112, temperature 97.7 F (36.5 C), temperature source Oral, weight 166 lb 3.2 oz (75.4 kg).Body mass index is 29.27 kg/m.  General Appearance: Casual  Eye Contact:  Fair  Speech:  Clear and Coherent  Volume:  Normal  Mood:  Anxious  Affect:  Congruent  Thought Process:  Goal Directed and Descriptions of Associations: Intact  Orientation:  Full (Time, Place, and Person)  Thought Content: Logical   Suicidal Thoughts:  No  Homicidal Thoughts:  No  Memory:  Immediate;   Fair Recent;   Fair Remote;   Fair  Judgement:  Fair  Insight:  Fair  Psychomotor Activity:  Normal  Concentration:  Concentration: Fair and Attention Span: Fair  Recall:  [employer] of Knowledge: Fair  Language: Fair  Akathisia:  No  Handed:  Right  AIMS (if indicated): denies tremors, rigidity,stiffness  Assets:  Communication Skills Desire for Improvement Social Support  ADL's:  Intact  Cognition: WNL  Sleep:  restless   Screenings:   Assessment and Plan: Shawndell is a 52 year old Caucasian female, single, has a history of OCD, panic attacks, MDD, alcohol use disorder, scleroderma, pulmonary hypertension, presented to the clinic today for a follow-up visit.  Patient is biologically predisposed given her alcoholism as well as family history of mental health problems.  She also has psychosocial stressors of her own chronic mental illness.  Patient is currently making progress with regards to her mood symptoms.  She continues to be in psychotherapy.  Plan as noted below.  Plan For OCD Continue venlafaxine 187.5 mg p.o. daily Patient  will continue to wean off Klonopin.  She will start taking 0.25 mg 1 additional day every week and continue 0.5 mg the rest of the days.  For MDD proving Venlafaxine as prescribed Continue CBT with Dr. Marcell Anger in [place].  For panic attacks Continue CBT  Alcohol use disorder She continues to be in [employer] Gabapentin 200 mg p.o. nightly  For insomnia  she has not started trazodone yet.  She will start using it since sleep continues to be restless.  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  She will continue to follow-up with her primary providers.  Follow-up in clinic in 2 to 3 weeks or sooner if needed  More than 50 % of the time was spent for psychoeducation and supportive psychotherapy and care coordination.  This note was generated in part or whole with voice recognition software. Voice recognition is usually quite accurate but there are transcription errors that can and very often do occur. I apologize for any typographical errors that were not detected and corrected.      Jomarie Longs, MD 03/22/2018, 5:31 PM

## 2018-04-12 ENCOUNTER — Ambulatory Visit: Payer: 59 | Admitting: Psychiatry

## 2018-04-24 ENCOUNTER — Ambulatory Visit (INDEPENDENT_AMBULATORY_CARE_PROVIDER_SITE_OTHER): Payer: 59 | Admitting: Psychiatry

## 2018-04-24 ENCOUNTER — Encounter: Payer: Self-pay | Admitting: Psychiatry

## 2018-04-24 ENCOUNTER — Other Ambulatory Visit: Payer: Self-pay

## 2018-04-24 VITALS — BP 114/78 | HR 118 | Temp 97.3°F | Wt 159.4 lb

## 2018-04-24 DIAGNOSIS — F1020 Alcohol dependence, uncomplicated: Secondary | ICD-10-CM

## 2018-04-24 DIAGNOSIS — F429 Obsessive-compulsive disorder, unspecified: Secondary | ICD-10-CM

## 2018-04-24 DIAGNOSIS — F410 Panic disorder [episodic paroxysmal anxiety] without agoraphobia: Secondary | ICD-10-CM

## 2018-04-24 NOTE — Progress Notes (Signed)
BH MD OP Progress Note  04/24/2018 5:53 PM Janice Mayo  MRN:  [id number]  Chief Complaint: ' I am here to establish care.' Chief Complaint    Follow-up; Medication Refill     HPI: Janice Mayo is a 53 year old Caucasian female, single, employed, lives in [place], has a history of OCD, MDD, panic attacks, alcohol use disorder, scleroderma, presented to the clinic today for a follow-up visit.  Patient today reports that her father is currently going  through some health problems.  He currently has hospice care.  She reports that kind of makes her stressed out but she has been coping okay.  She reports work is going well.  She has not been using her Klonopin as much and uses only 1/2 pill as needed very rarely.  She has been compliant with her other medications.  She has not been using alcohol and her last drink was sometime in December.  Patient denies any suicidality, homicidality or perceptual disturbances.  She continues to be in therapy which is going well.      Visit Diagnosis:    ICD-10-CM   1. Obsessive-compulsive disorder with good or fair insight F42.9   2. Panic disorder F41.0   3. Alcohol use disorder, moderate, dependence (HCC) F10.20    in early remission    Past Psychiatric History: I have reviewed past psychiatric history from my progress note on 02/21/2018.  Past trials of Prozac, Klonopin, Ativan, venlafaxine.   Past Medical History:  Past Medical History:  Diagnosis Date  . Acute renal failure syndrome (HCC)   . Chronic kidney disease   . Chronic pulmonary heart disease (HCC)   . Diffuse spasm of esophagus   . Lung disease with systemic sclerosis (HCC)   . Obsessive-compulsive disorder   . Osteoporosis   . Raynaud's disease   . Systemic scleroses (HCC)    History reviewed. No pertinent surgical history.  Family Psychiatric History: I have reviewed family psychiatric history from my progress note on 02/21/2018.  Family History:  Family History   Problem Relation Age of Onset  . Anxiety disorder Sister   . Anxiety disorder Brother     Social History: Reviewed social history from my progress note on 02/21/2018. Social History   Socioeconomic History  . Marital status: Single    Spouse name: Not on file  . Number of children: 0  . Years of education: Not on file  . Highest education level: High school graduate  Occupational History  . Not on file  Social Needs  . Financial resource strain: Not hard at all  . Food insecurity:    Worry: Never true    Inability: Never true  . Transportation needs:    Medical: No    Non-medical: No  Tobacco Use  . Smoking status: Never Smoker  . Smokeless tobacco: Never Used  Substance and Sexual Activity  . Alcohol use: Yes    Alcohol/week: 20.0 standard drinks    Types: 20 Shots of liquor per week  . Drug use: Not Currently  . Sexual activity: Not Currently  Lifestyle  . Physical activity:    Days per week: 0 days    Minutes per session: 0 min  . Stress: Not on file  Relationships  . Social connections:    Talks on phone: Not on file    Gets together: Not on file    Attends religious service: Never    Active member of club or organization: Yes    Attends meetings of  clubs or organizations: More than 4 times per year    Relationship status: Never married  Other Topics Concern  . Not on file  Social History Narrative  . Not on file    Allergies:  Allergies  Allergen Reactions  . Sulfasalazine Hives  . Erythromycin Hives  . Hydroxychloroquine Hives  . Septra [Sulfamethoxazole-Trimethoprim] Hives  . Sulfa Antibiotics Hives  . Tetracyclines & Related Hives  . Trimethoprim Other (See Comments)  . Simvastatin Rash    Metabolic Disorder Labs: No results found for: HGBA1C, MPG No results found for: PROLACTIN No results found for: CHOL, TRIG, HDL, CHOLHDL, VLDL, LDLCALC No results found for: TSH  Therapeutic Level Labs: No results found for: LITHIUM No results  found for: VALPROATE No components found for:  CBMZ  Current Medications: Current Outpatient Medications  Medication Sig Dispense Refill  . captopril (CAPOTEN) 100 MG tablet Take by mouth.    . Cholecalciferol (VITAMIN D-1000 MAX ST) 25 MCG (1000 UT) tablet Take by mouth.    . clonazePAM (KLONOPIN) 1 MG tablet take 1 Tablet by oral route every 8 hours    . denosumab (PROLIA) 60 MG/ML SOSY injection every 6 (six) months    . gabapentin (NEURONTIN) 100 MG capsule Take 2 capsules (200 mg total) by mouth at bedtime. 60 capsule 1  . Multiple Vitamins-Minerals (CENTRUM FLAVOR BURST ADULT) CHEW Chew by mouth.    . mycophenolate (CELLCEPT) 500 MG tablet Take by mouth.    Marland Kitchen. NIFEdipine (PROCARDIA XL/NIFEDICAL XL) 60 MG 24 hr tablet Take by mouth.    Marland Kitchen. omeprazole (PRILOSEC) 20 MG capsule Take by mouth.    . traZODone (DESYREL) 50 MG tablet Take 0.5-1 tablets (25-50 mg total) by mouth at bedtime. For sleep 30 tablet 1  . venlafaxine XR (EFFEXOR-XR) 150 MG 24 hr capsule Take 1 capsule (150 mg total) by mouth daily with breakfast. To be combined with 37.5 mg 30 capsule 1  . venlafaxine XR (EFFEXOR-XR) 37.5 MG 24 hr capsule Take 1 capsule (37.5 mg total) by mouth daily with breakfast. To be combined to 150 mg 30 capsule 1   No current facility-administered medications for this visit.      Musculoskeletal: Strength & Muscle Tone: within normal limits Gait & Station: normal Patient leans: N/A  Psychiatric Specialty Exam: Review of Systems  Psychiatric/Behavioral: The patient is nervous/anxious (situational).   All other systems reviewed and are negative.   Blood pressure 114/78, pulse (!) 118, temperature (!) 97.3 F (36.3 C), temperature source Oral, weight 159 lb 6.4 oz (72.3 kg).Body mass index is 28.07 kg/m.  General Appearance: Casual  Eye Contact:  Fair  Speech:  Clear and Coherent  Volume:  Normal  Mood:  Euthymic  Affect:  Congruent  Thought Process:  Goal Directed and Descriptions  of Associations: Intact  Orientation:  Full (Time, Place, and Person)  Thought Content: Logical   Suicidal Thoughts:  No  Homicidal Thoughts:  No  Memory:  Immediate;   Fair Recent;   Fair Remote;   Fair  Judgement:  Fair  Insight:  Fair  Psychomotor Activity:  Normal  Concentration:  Concentration: Fair and Attention Span: Fair  Recall:  [employer]  Fund of Knowledge: Fair  Language: Fair  Akathisia:  No  Handed:  Right  AIMS (if indicated): denies tremors, rigidity,stiffness  Assets:  Communication Skills Desire for Improvement Social Support  ADL's:  Intact  Cognition: WNL  Sleep:  Fair   Screenings:   Assessment and Plan: Janice Mayo is  53 year old Caucasian female, single, has a history of OCD, panic attacks, MDD, alcohol use disorder, scleroderma, pulmonary hypertension, presented to the clinic today for a follow-up visit.   Patient is biologically predisposed given her alcoholism as well as family history of mental health problems.  She also has psychosocial stressors of her own chronic mental illness.  Patient reports she continues to have some psychosocial stressors of her father's health problems.  She however is coping well.  She continues to limit the use of alcohol.  Plan as noted below.  Plan OCD-improving Venlafaxine 187.5 mg p.o. daily Continue to limit the use of Klonopin.  For MDD-improving Venlafaxine as prescribed Continue CBT with Dr. Marcell Anger in [place].  For panic attacks-improving Continue CBT  Alcohol use disorder-improving She has been sober since December. She continues to go for [employer] Gabapentin 200 mg p.o. nightly  For insomnia-improving She reports sleep is improving.  Follow-up in clinic in 6 to 8 weeks or sooner if needed.  I have spent atleast 15 minutes face to face with patient today. More than 50 % of the time was spent for psychoeducation and supportive psychotherapy and care coordination.   This note was generated in part or  whole with voice recognition software. Voice recognition is usually quite accurate but there are transcription errors that can and very often do occur. I apologize for any typographical errors that were not detected and corrected.         Jomarie Longs, MD 04/24/2018, 5:53 PM

## 2018-06-01 ENCOUNTER — Other Ambulatory Visit: Payer: Self-pay | Admitting: Psychiatry

## 2018-06-01 DIAGNOSIS — F429 Obsessive-compulsive disorder, unspecified: Secondary | ICD-10-CM

## 2018-06-04 ENCOUNTER — Other Ambulatory Visit: Payer: Self-pay | Admitting: Psychiatry

## 2018-06-04 DIAGNOSIS — F429 Obsessive-compulsive disorder, unspecified: Secondary | ICD-10-CM

## 2018-06-04 DIAGNOSIS — F410 Panic disorder [episodic paroxysmal anxiety] without agoraphobia: Secondary | ICD-10-CM

## 2018-06-12 ENCOUNTER — Encounter: Payer: Self-pay | Admitting: Psychiatry

## 2018-06-12 ENCOUNTER — Other Ambulatory Visit: Payer: Self-pay

## 2018-06-12 ENCOUNTER — Ambulatory Visit (INDEPENDENT_AMBULATORY_CARE_PROVIDER_SITE_OTHER): Payer: 59 | Admitting: Psychiatry

## 2018-06-12 VITALS — BP 113/80 | HR 106 | Temp 98.8°F | Wt 156.0 lb

## 2018-06-12 DIAGNOSIS — F410 Panic disorder [episodic paroxysmal anxiety] without agoraphobia: Secondary | ICD-10-CM | POA: Diagnosis not present

## 2018-06-12 DIAGNOSIS — Z634 Disappearance and death of family member: Secondary | ICD-10-CM

## 2018-06-12 DIAGNOSIS — F429 Obsessive-compulsive disorder, unspecified: Secondary | ICD-10-CM | POA: Diagnosis not present

## 2018-06-12 DIAGNOSIS — F1021 Alcohol dependence, in remission: Secondary | ICD-10-CM | POA: Diagnosis not present

## 2018-06-12 MED ORDER — GABAPENTIN 100 MG PO CAPS: 200.0000 mg | ORAL_CAPSULE | Freq: Every day | ORAL | 1 refills | Status: DC

## 2018-06-12 NOTE — Progress Notes (Signed)
BH MD OP Progress Note  06/12/2018 5:25 PM Weber CooksWendy L Mayo  MRN:  [id number]  Chief Complaint: ' I am here for follow up." Chief Complaint    Follow-up; Medication Refill     HPI: Janice FailWendy is a 53 year old Caucasian female, single, employed, lives in [place], has a history of OCD, MDD, panic attacks, alcohol use disorder, scleroderma, presented to clinic today for a follow-up visit.  Patient today reports her father passed away on January 24.  He was in hospice care.  Patient reports she continues to have some days when she misses him.  She has started grief counseling with her therapist Dr. Marcell AngerJudy Mayo.  She reports she also has a lot of social support from her siblings.  Patient reports she is compliant with her medications as prescribed.  She denies any significant mood lability or depressive symptoms.    Patient reports she continues to stay away from alcohol and her last drink was in December.  She continues to go for [employer] meetings.  Patient denies any other concerns today. Visit Diagnosis:    ICD-10-CM   1. Obsessive-compulsive disorder with good or fair insight F42.9 gabapentin (NEURONTIN) 100 MG capsule   improving  2. Panic disorder F41.0 gabapentin (NEURONTIN) 100 MG capsule   improving  3. Bereavement Z63.4   4. Alcohol use disorder, moderate, in early remission (HCC) F10.21 gabapentin (NEURONTIN) 100 MG capsule   in early remission    Past Psychiatric History: I have reviewed past psychiatric history from my progress note on 02/21/2018.  Past trials of Prozac, Klonopin, Ativan, venlafaxine.  Past Medical History:  Past Medical History:  Diagnosis Date  . Acute renal failure syndrome (HCC)   . Chronic kidney disease   . Chronic pulmonary heart disease (HCC)   . Diffuse spasm of esophagus   . Lung disease with systemic sclerosis (HCC)   . Obsessive-compulsive disorder   . Osteoporosis   . Raynaud's disease   . Systemic scleroses (HCC)    History reviewed. No pertinent  surgical history.  Family Psychiatric History: I have reviewed family psychiatric history from my progress note on 02/21/2018.  Family History:  Family History  Problem Relation Age of Onset  . Anxiety disorder Sister   . Anxiety disorder Brother     Social History: Reviewed social history from my progress note on 02/21/2018. Social History   Socioeconomic History  . Marital status: Single    Spouse name: Not on file  . Number of children: 0  . Years of education: Not on file  . Highest education level: High school graduate  Occupational History  . Not on file  Social Needs  . Financial resource strain: Not hard at all  . Food insecurity:    Worry: Never true    Inability: Never true  . Transportation needs:    Medical: No    Non-medical: No  Tobacco Use  . Smoking status: Never Smoker  . Smokeless tobacco: Never Used  Substance and Sexual Activity  . Alcohol use: Yes    Alcohol/week: 20.0 standard drinks    Types: 20 Shots of liquor per week  . Drug use: Not Currently  . Sexual activity: Not Currently  Lifestyle  . Physical activity:    Days per week: 0 days    Minutes per session: 0 min  . Stress: Not on file  Relationships  . Social connections:    Talks on phone: Not on file    Gets together: Not on file  Attends religious service: Never    Active member of club or organization: Yes    Attends meetings of clubs or organizations: More than 4 times per year    Relationship status: Never married  Other Topics Concern  . Not on file  Social History Narrative  . Not on file    Allergies:  Allergies  Allergen Reactions  . Sulfasalazine Hives  . Erythromycin Hives  . Hydroxychloroquine Hives  . Septra [Sulfamethoxazole-Trimethoprim] Hives  . Sulfa Antibiotics Hives  . Tetracyclines & Related Hives  . Trimethoprim Other (See Comments)  . Simvastatin Rash    Metabolic Disorder Labs: No results found for: HGBA1C, MPG No results found for:  PROLACTIN No results found for: CHOL, TRIG, HDL, CHOLHDL, VLDL, LDLCALC No results found for: TSH  Therapeutic Level Labs: No results found for: LITHIUM No results found for: VALPROATE No components found for:  CBMZ  Current Medications: Current Outpatient Medications  Medication Sig Dispense Refill  . captopril (CAPOTEN) 100 MG tablet Take by mouth.    . Cholecalciferol (VITAMIN D-1000 MAX ST) 25 MCG (1000 UT) tablet Take by mouth.    . denosumab (PROLIA) 60 MG/ML SOSY injection every 6 (six) months    . gabapentin (NEURONTIN) 100 MG capsule Take 2 capsules (200 mg total) by mouth at bedtime. 60 capsule 1  . Multiple Vitamins-Minerals (CENTRUM FLAVOR BURST ADULT) CHEW Chew by mouth.    . mycophenolate (CELLCEPT) 500 MG tablet Take by mouth.    Marland Kitchen NIFEdipine (PROCARDIA XL/NIFEDICAL XL) 60 MG 24 hr tablet Take by mouth.    Marland Kitchen omeprazole (PRILOSEC) 20 MG capsule Take by mouth.    . traZODone (DESYREL) 50 MG tablet Take 0.5-1 tablets (25-50 mg total) by mouth at bedtime. For sleep 30 tablet 1  . venlafaxine XR (EFFEXOR-XR) 150 MG 24 hr capsule TAKE 1 CAPSULE BY MOUTH ONCE DAILY WITH BREAKFAST WITH 37.5MG  CAPSULE 30 capsule 1  . venlafaxine XR (EFFEXOR-XR) 37.5 MG 24 hr capsule TAKE 1 CAPSULE BY MOUTH ONCE DAILY WITH BREAKFAST WITH 150MG  CAPSULE 30 capsule 1   No current facility-administered medications for this visit.      Musculoskeletal: Strength & Muscle Tone: within normal limits Gait & Station: normal Patient leans: N/A  Psychiatric Specialty Exam: Review of Systems  Psychiatric/Behavioral: Positive for depression (improving).  All other systems reviewed and are negative.   Blood pressure 113/80, pulse (!) 106, temperature 98.8 F (37.1 C), temperature source Oral, weight 156 lb (70.8 kg).Body mass index is 27.47 kg/m.  General Appearance: Casual  Eye Contact:  Fair  Speech:  Normal Rate  Volume:  Normal  Mood:  Depressed improving  Affect:  Congruent  Thought Process:   Goal Directed and Descriptions of Associations: Intact  Orientation:  Full (Time, Place, and Person)  Thought Content: Logical   Suicidal Thoughts:  No  Homicidal Thoughts:  No  Memory:  Immediate;   Fair Recent;   Fair Remote;   Fair  Judgement:  Fair  Insight:  Good  Psychomotor Activity:  Normal  Concentration:  Concentration: Fair and Attention Span: Fair  Recall:  [employer] of Knowledge: Fair  Language: Fair  Akathisia:  No  Handed:  Right  AIMS (if indicated): Denies tremors, rigidity, stiffness  Assets:  Communication Skills Desire for Improvement Social Support  ADL's:  Intact  Cognition: WNL  Sleep:  Fair   Screenings:   Assessment and Plan: Yaresly is a 53 year old Caucasian female, single, has a history of OCD, panic attacks,  MDD, alcohol use disorder, scleroderma, pulmonary hypertension, presented to clinic today for a follow-up visit.  Patient is biologically predisposed given her history of alcoholism as well as family history of mental health problems.  She also has psychosocial stressors of her own chronic mental illness.  Patient is currently grieving the loss of her father.  Patient will continue psychotherapy sessions for the same.  Plan OCD-improving Venlafaxine 187.5 mg p.o. daily  For MDD-improving Venlafaxine as prescribed Continue CBT with Dr. Marcell Anger in [place].  Panic attacks-improving She reports she will continue CBT.  For alcohol use disorder in remission She has been sober since December. Continue gabapentin 200 mg p.o. nightly. She will continue AA meetings.  For insomnia-improving She will continue to monitor it closely.  For bereavement She will continue grief counseling.  Follow-up in clinic in 2 to 3 months or sooner if needed.  I have spent atleast 15 minutes  face to face with patient today. More than 50 % of the time was spent for psychoeducation and supportive psychotherapy and care coordination.  This note was  generated in part or whole with voice recognition software. Voice recognition is usually quite accurate but there are transcription errors that can and very often do occur. I apologize for any typographical errors that were not detected and corrected.       Jomarie Longs, MD 06/12/2018, 5:25 PM

## 2018-08-16 ENCOUNTER — Other Ambulatory Visit: Payer: Self-pay | Admitting: Psychiatry

## 2018-08-16 DIAGNOSIS — F429 Obsessive-compulsive disorder, unspecified: Secondary | ICD-10-CM

## 2018-09-11 ENCOUNTER — Other Ambulatory Visit: Payer: Self-pay

## 2018-09-11 ENCOUNTER — Encounter: Payer: Self-pay | Admitting: Psychiatry

## 2018-09-11 ENCOUNTER — Ambulatory Visit (INDEPENDENT_AMBULATORY_CARE_PROVIDER_SITE_OTHER): Payer: 59 | Admitting: Psychiatry

## 2018-09-11 DIAGNOSIS — F410 Panic disorder [episodic paroxysmal anxiety] without agoraphobia: Secondary | ICD-10-CM

## 2018-09-11 DIAGNOSIS — F1021 Alcohol dependence, in remission: Secondary | ICD-10-CM | POA: Diagnosis not present

## 2018-09-11 DIAGNOSIS — F429 Obsessive-compulsive disorder, unspecified: Secondary | ICD-10-CM | POA: Diagnosis not present

## 2018-09-11 MED ORDER — GABAPENTIN 100 MG PO CAPS: 200.0000 mg | ORAL_CAPSULE | Freq: Every day | ORAL | 1 refills | Status: DC

## 2018-09-11 MED ORDER — VENLAFAXINE HCL ER 150 MG PO CP24: ORAL_CAPSULE | ORAL | 1 refills | Status: DC

## 2018-09-11 MED ORDER — VENLAFAXINE HCL ER 37.5 MG PO CP24: ORAL_CAPSULE | ORAL | 1 refills | Status: DC

## 2018-09-11 NOTE — Progress Notes (Addendum)
Virtual Visit via Telephone Note  I connected with Janice Mayo on 09/11/18 at  4:30 PM EDT by telephone and verified that I am speaking with the correct person using two identifiers.   I discussed the limitations, risks, security and privacy concerns of performing an evaluation and management service by telephone and the availability of in person appointments. I also discussed with the patient that there may be a patient responsible charge related to this service. The patient expressed understanding and agreed to proceed.      I discussed the assessment and treatment plan with the patient. The patient was provided an opportunity to ask questions and all were answered. The patient agreed with the plan and demonstrated an understanding of the instructions.   The patient was advised to call back or seek an in-person evaluation if the symptoms worsen or if the condition fails to improve as anticipated.   [facility] MD OP Progress Note  09/11/2018 5:07 PM Janice Mayo  MRN:  [id number]  Chief Complaint:  Chief Complaint    Follow-up     HPI: Janice Mayo is a 53 year old Caucasian female, single, employed, lives in [place], has a history of OCD, MDD, panic attacks, alcohol use disorder, scleroderma was evaluated by phone today.  Patient preferred to do a phone call.  Patient today reports that she has been coping okay with her current situational stressors.  She does have the psychosocial stressors of current COVID-19 outbreak.  Her father also passed away recently on 27-May-2022.  She however reports she currently stays in her father's home and has been coping better with the loss.  She continues to have good support system from her therapist Dr. Genene Churn as well as her siblings.  Patient is compliant on medications.  She denies any significant mood lability.  She denies any side effects to the medications.  Patient continues to stay away from alcohol.  Patient denies any suicidality,  homicidality or perceptual disturbances. Visit Diagnosis:    ICD-10-CM   1. Obsessive-compulsive disorder with good or fair insight F42.9 venlafaxine XR (EFFEXOR-XR) 150 MG 24 hr capsule    venlafaxine XR (EFFEXOR-XR) 37.5 MG 24 hr capsule    gabapentin (NEURONTIN) 100 MG capsule   improving  2. Panic disorder F41.0 venlafaxine XR (EFFEXOR-XR) 150 MG 24 hr capsule    gabapentin (NEURONTIN) 100 MG capsule   improving  3. Alcohol use disorder, moderate, in early remission ([place]) F10.21 gabapentin (NEURONTIN) 100 MG capsule   in early remission    Past Psychiatric History: I have reviewed past psychiatric history from my progress note on 02/21/2018.  Past trials of Prozac, Klonopin, Ativan, venlafaxine.  Past Medical History:  Past Medical History:  Diagnosis Date  . Acute renal failure syndrome ([place])   . Chronic kidney disease   . Chronic pulmonary heart disease ([place])   . Diffuse spasm of esophagus   . Lung disease with systemic sclerosis ([place])   . Obsessive-compulsive disorder   . Osteoporosis   . Raynaud's disease   . Systemic scleroses ([place])    History reviewed. No pertinent surgical history.  Family Psychiatric History: Reviewed family psychiatric history from my progress note on 02/21/2018  Family History:  Family History  Problem Relation Age of Onset  . Anxiety disorder Sister   . Anxiety disorder Brother     Social History: Reviewed social history from my progress note on 02/21/2018. Social History   Socioeconomic History  . Marital status: Single    Spouse  name: Not on file  . Number of children: 0  . Years of education: Not on file  . Highest education level: High school graduate  Occupational History  . Not on file  Social Needs  . Financial resource strain: Not hard at all  . Food insecurity:    Worry: Never true    Inability: Never true  . Transportation needs:    Medical: No    Non-medical: No  Tobacco Use  . Smoking status: Never Smoker  .  Smokeless tobacco: Never Used  Substance and Sexual Activity  . Alcohol use: Yes    Alcohol/week: 20.0 standard drinks    Types: 20 Shots of liquor per week  . Drug use: Not Currently  . Sexual activity: Not Currently  Lifestyle  . Physical activity:    Days per week: 0 days    Minutes per session: 0 min  . Stress: Not on file  Relationships  . Social connections:    Talks on phone: Not on file    Gets together: Not on file    Attends religious service: Never    Active member of club or organization: Yes    Attends meetings of clubs or organizations: More than 4 times per year    Relationship status: Never married  Other Topics Concern  . Not on file  Social History Narrative  . Not on file    Allergies:  Allergies  Allergen Reactions  . Sulfasalazine Hives  . Erythromycin Hives  . Hydroxychloroquine Hives  . Septra [Sulfamethoxazole-Trimethoprim] Hives  . Sulfa Antibiotics Hives  . Tetracyclines & Related Hives  . Trimethoprim Other (See Comments)  . Simvastatin Rash    Metabolic Disorder Labs: No results found for: HGBA1C, MPG No results found for: PROLACTIN No results found for: CHOL, TRIG, HDL, CHOLHDL, VLDL, LDLCALC No results found for: TSH  Therapeutic Level Labs: No results found for: LITHIUM No results found for: VALPROATE No components found for:  CBMZ  Current Medications: Current Outpatient Medications  Medication Sig Dispense Refill  . captopril (CAPOTEN) 100 MG tablet Take by mouth.    . captopril (CAPOTEN) 100 MG tablet     . Cholecalciferol (VITAMIN D-1000 MAX ST) 25 MCG (1000 UT) tablet Take by mouth.    . denosumab (PROLIA) 60 MG/ML SOSY injection every 6 (six) months    . gabapentin (NEURONTIN) 100 MG capsule Take 2 capsules (200 mg total) by mouth at bedtime. 180 capsule 1  . Multiple Vitamins-Minerals (CENTRUM FLAVOR BURST ADULT) CHEW Chew by mouth.    . mycophenolate (CELLCEPT) 500 MG tablet Take by mouth.    . mycophenolate (CELLCEPT)  500 MG tablet 2,000 mg. bid    . NIFEdipine (PROCARDIA XL/NIFEDICAL XL) 60 MG 24 hr tablet Take by mouth.    Marland Kitchen. omeprazole (PRILOSEC) 20 MG capsule Take by mouth.    . traZODone (DESYREL) 50 MG tablet Take 0.5-1 tablets (25-50 mg total) by mouth at bedtime. For sleep 30 tablet 1  . venlafaxine XR (EFFEXOR-XR) 150 MG 24 hr capsule TAKE 1 CAPSULE BY MOUTH ONCE DAILY WITH BREAKFAST WITH 37.5MG  CAPSULE 90 capsule 1  . venlafaxine XR (EFFEXOR-XR) 37.5 MG 24 hr capsule TAKE 1 CAPSULE BY MOUTH ONCE DAILY WITH BREAKFAST WITH 150MG  CAPS 90 capsule 1   No current facility-administered medications for this visit.      Musculoskeletal: Strength & Muscle Tone: UTA Gait & Station: UTA Patient leans: N/A  Psychiatric Specialty Exam: Review of Systems  Psychiatric/Behavioral: The patient is  nervous/anxious.   All other systems reviewed and are negative.   There were no vitals taken for this visit.There is no height or weight on file to calculate BMI.  General Appearance: UTA  Eye Contact:  UTA  Speech:  Clear and Coherent  Volume:  Normal  Mood:  Anxious coping ok  Affect:  UTA  Thought Process:  Goal Directed and Descriptions of Associations: Intact  Orientation:  Full (Time, Place, and Person)  Thought Content: Logical   Suicidal Thoughts:  No  Homicidal Thoughts:  No  Memory:  Immediate;   Fair Recent;   Fair Remote;   Fair  Judgement:  Fair  Insight:  Fair  Psychomotor Activity:  UTA  Concentration:  Concentration: Fair and Attention Span: Fair  Recall:  [employer]  Fund of Knowledge: Fair  Language: Fair  Akathisia:  No  Handed:  Right  AIMS (if indicated): Denies tremors, rigidity  Assets:  Communication Skills Desire for Improvement Housing Social Support  ADL's:  Intact  Cognition: WNL  Sleep:  Fair   Screenings:   Assessment and Plan: [name] is a 53 year old Caucasian female, single, has a history of OCD, panic attacks, MDD, alcohol use disorder, scleroderma, pulmonary  hypertension, was evaluated by phone today.  Patient was offered a video called however declined.  Patient is biologically predisposed given her history of alcoholism as well as family history of mental health problems.  She also has psychosocial stressors of her own chronic mental illness, her father's death, COVID-19 outbreak.  Patient continues to make progress on the current medication regimen and has good support system..  Plan as noted below.  Plan  OCD-stable Venlafaxine 187.5 mg p.o. daily. Continue CBT with Dr.Judy Charm BargesButler   For panic attacks-improving She will continue CBT  For alcohol use disorder in remission She has been sober since December.  Continue gabapentin 200 mg p.o. nightly she will continue AA meetings.  For insomnia-stable She will continue to monitor closely.  For bereavement-stable She will continue grief counseling.  I will up in clinic in 2 to 3 months or sooner if needed.  September 10 at 4:30 PM.  I have spent atleast 15 minutes non face to face with patient today. More than 50 % of the time was spent for psychoeducation and supportive psychotherapy and care coordination.  This note was generated in part or whole with voice recognition software. Voice recognition is usually quite accurate but there are transcription errors that can and very often do occur. I apologize for any typographical errors that were not detected and corrected.        Jomarie LongsSaramma Dyron Kawano, MD 09/11/2018, 5:07 PM

## 2018-12-13 ENCOUNTER — Ambulatory Visit (INDEPENDENT_AMBULATORY_CARE_PROVIDER_SITE_OTHER): Payer: 59 | Admitting: Psychiatry

## 2018-12-13 ENCOUNTER — Encounter: Payer: Self-pay | Admitting: Psychiatry

## 2018-12-13 ENCOUNTER — Other Ambulatory Visit: Payer: Self-pay

## 2018-12-13 DIAGNOSIS — F429 Obsessive-compulsive disorder, unspecified: Secondary | ICD-10-CM | POA: Insufficient documentation

## 2018-12-13 DIAGNOSIS — F1021 Alcohol dependence, in remission: Secondary | ICD-10-CM | POA: Diagnosis not present

## 2018-12-13 DIAGNOSIS — F410 Panic disorder [episodic paroxysmal anxiety] without agoraphobia: Secondary | ICD-10-CM | POA: Diagnosis not present

## 2018-12-13 NOTE — Progress Notes (Signed)
Virtual Visit via Telephone Note  I connected with Janice Mayo on 12/13/18 at  4:30 PM EDT by telephone and verified that I am speaking with the correct person using two identifiers.   I discussed the limitations, risks, security and privacy concerns of performing an evaluation and management service by telephone and the availability of in person appointments. I also discussed with the patient that there may be a patient responsible charge related to this service. The patient expressed understanding and agreed to proceed.     I discussed the assessment and treatment plan with the patient. The patient was provided an opportunity to ask questions and all were answered. The patient agreed with the plan and demonstrated an understanding of the instructions.   The patient was advised to call back or seek an in-person evaluation if the symptoms worsen or if the condition fails to improve as anticipated.  Provider Location : [facility] Patient Location : Home   [facility] MD OP Progress Note  12/13/2018 5:06 PM Janice Mayo  MRN:  [id number]  Chief Complaint:  Chief Complaint    Follow-up     HPI: Janice Mayo is a 53 year old Caucasian female, single, employed, lives in [place], has a history of OCD, MDD, panic attacks, alcohol use disorder, scleroderma was evaluated by phone today.  Patient today reports she is currently making progress.  She denies any significant depression or anxiety symptoms.  She has been coping with the loss of her father better than before.  She reports sleep is good.  She reports appetite is fair.  She continues to stay away from alcohol.  She reports she continues to follow-up with her therapist Dr. Marcell Anger and that has been helpful.  Patient denies any suicidality, homicidality or perceptual disturbances.  She is compliant on medications and denies any side effects.   Visit Diagnosis:    ICD-10-CM   1. Obsessive-compulsive disorder with good or fair insight  F42.9    2. Panic disorder  F41.0   3. Alcohol use disorder, moderate, in early remission (HCC)  F10.21     Past Psychiatric History: I have reviewed past psychiatric history from my progress note on 02/21/2018.  Past trials of Prozac, Klonopin, Ativan, venlafaxine  Past Medical History:  Past Medical History:  Diagnosis Date  . Acute renal failure syndrome (HCC)   . Chronic kidney disease   . Chronic pulmonary heart disease (HCC)   . Diffuse spasm of esophagus   . Lung disease with systemic sclerosis (HCC)   . Obsessive-compulsive disorder   . Osteoporosis   . Raynaud's disease   . Systemic scleroses (HCC)    History reviewed. No pertinent surgical history.  Family Psychiatric History: I have reviewed family psychiatric history from my progress note on 02/21/2018  Family History:  Family History  Problem Relation Age of Onset  . Anxiety disorder Sister   . Anxiety disorder Brother     Social History: I have reviewed social history from my progress note on 02/21/2018 Social History   Socioeconomic History  . Marital status: Single    Spouse name: Not on file  . Number of children: 0  . Years of education: Not on file  . Highest education level: High school graduate  Occupational History  . Not on file  Social Needs  . Financial resource strain: Not hard at all  . Food insecurity    Worry: Never true    Inability: Never true  . Transportation needs    Medical:  No    Non-medical: No  Tobacco Use  . Smoking status: Never Smoker  . Smokeless tobacco: Never Used  Substance and Sexual Activity  . Alcohol use: Yes    Alcohol/week: 20.0 standard drinks    Types: 20 Shots of liquor per week  . Drug use: Not Currently  . Sexual activity: Not Currently  Lifestyle  . Physical activity    Days per week: 0 days    Minutes per session: 0 min  . Stress: Not on file  Relationships  . Social Musicianconnections    Talks on phone: Not on file    Gets together: Not on file    Attends  religious service: Never    Active member of club or organization: Yes    Attends meetings of clubs or organizations: More than 4 times per year    Relationship status: Never married  Other Topics Concern  . Not on file  Social History Narrative  . Not on file    Allergies:  Allergies  Allergen Reactions  . Sulfasalazine Hives  . Erythromycin Hives  . Hydroxychloroquine Hives  . Septra [Sulfamethoxazole-Trimethoprim] Hives  . Sulfa Antibiotics Hives  . Tetracyclines & Related Hives  . Trimethoprim Other (See Comments)  . Simvastatin Rash    Metabolic Disorder Labs: No results found for: HGBA1C, MPG No results found for: PROLACTIN No results found for: CHOL, TRIG, HDL, CHOLHDL, VLDL, LDLCALC No results found for: TSH  Therapeutic Level Labs: No results found for: LITHIUM No results found for: VALPROATE No components found for:  CBMZ  Current Medications: Current Outpatient Medications  Medication Sig Dispense Refill  . captopril (CAPOTEN) 100 MG tablet Take by mouth.    . captopril (CAPOTEN) 100 MG tablet     . Cholecalciferol (VITAMIN D-1000 MAX ST) 25 MCG (1000 UT) tablet Take by mouth.    . denosumab (PROLIA) 60 MG/ML SOSY injection every 6 (six) months    . gabapentin (NEURONTIN) 100 MG capsule Take 2 capsules (200 mg total) by mouth at bedtime. 180 capsule 1  . Multiple Vitamins-Minerals (CENTRUM FLAVOR BURST ADULT) CHEW Chew by mouth.    . mycophenolate (CELLCEPT) 500 MG tablet Take by mouth.    . mycophenolate (CELLCEPT) 500 MG tablet 2,000 mg. bid    . NIFEdipine (PROCARDIA XL/NIFEDICAL XL) 60 MG 24 hr tablet Take by mouth.    Marland Kitchen. omeprazole (PRILOSEC) 20 MG capsule Take by mouth.    . traZODone (DESYREL) 50 MG tablet Take 0.5-1 tablets (25-50 mg total) by mouth at bedtime. For sleep 30 tablet 1  . venlafaxine XR (EFFEXOR-XR) 150 MG 24 hr capsule TAKE 1 CAPSULE BY MOUTH ONCE DAILY WITH BREAKFAST WITH 37.5MG  CAPSULE 90 capsule 1  . venlafaxine XR (EFFEXOR-XR) 37.5  MG 24 hr capsule TAKE 1 CAPSULE BY MOUTH ONCE DAILY WITH BREAKFAST WITH 150MG  CAPS 90 capsule 1   No current facility-administered medications for this visit.      Musculoskeletal: Strength & Muscle Tone: UTA Gait & Station: Reports as WNL  Patient leans: N/A  Psychiatric Specialty Exam: Review of Systems  Psychiatric/Behavioral: Negative for depression, hallucinations, substance abuse and suicidal ideas. The patient is not nervous/anxious.   All other systems reviewed and are negative.   There were no vitals taken for this visit.There is no height or weight on file to calculate BMI.  General Appearance: UTA  Eye Contact:  UTA  Speech:  Clear and Coherent  Volume:  Normal  Mood:  Euthymic  Affect:  UTA  Thought Process:  Goal Directed and Descriptions of Associations: Intact  Orientation:  Full (Time, Place, and Person)  Thought Content: Logical   Suicidal Thoughts:  No  Homicidal Thoughts:  No  Memory:  Immediate;   Fair Recent;   Fair Remote;   Fair  Judgement:  Fair  Insight:  Fair  Psychomotor Activity:  UTA  Concentration:  Concentration: Fair and Attention Span: Fair  Recall:  [employer] of Knowledge: Fair  Language: Fair  Akathisia:  No  Handed:  Right  AIMS (if indicated): Denies tremors, rigidity  Assets:  Communication Skills Desire for Improvement Social Support  ADL's:  Intact  Cognition: WNL  Sleep:  Fair   Screenings:   Assessment and Plan: Janice Mayo is a 53 year old Caucasian female, single, has a history of OCD, panic attacks, MDD, alcohol use disorder, scleroderma, pulmonary hypertension was evaluated by phone today.  Patient is biologically predisposed given her history of alcoholism as well as family history of mental health problems . She also has psychosocial stressors of her own chronic mental health issues, father's death, COVID-19 outbreak.  Patient continues to make progress on current medication regimen.  Plan as noted  below.  Plan OCD-stable Venlafaxine 187.5 mg p.o. daily Continue CBT with Dr. Curt Bears  Panic attacks-improving She will continue CBT  For alcohol use disorder in remission She has been sober since December. Continue gabapentin 200 mg p.o. nightly. She will continue AA meetings.  For insomnia-stable Continue to monitor closely  Bereavement-stable We will continue grief counseling.  Follow-up in clinic in 2 to 3 months or sooner if needed.  December 16 at 4:30 PM  I have spent atleast 15 minutes non  face to face with patient today. More than 50 % of the time was spent for psychoeducation and supportive psychotherapy and care coordination. This note was generated in part or whole with voice recognition software. Voice recognition is usually quite accurate but there are transcription errors that can and very often do occur. I apologize for any typographical errors that were not detected and corrected.         Ursula Alert, MD 12/13/2018, 5:06 PM

## 2019-02-22 ENCOUNTER — Other Ambulatory Visit: Payer: Self-pay | Admitting: Psychiatry

## 2019-02-22 DIAGNOSIS — F429 Obsessive-compulsive disorder, unspecified: Secondary | ICD-10-CM

## 2019-02-22 DIAGNOSIS — F410 Panic disorder [episodic paroxysmal anxiety] without agoraphobia: Secondary | ICD-10-CM

## 2019-03-18 ENCOUNTER — Other Ambulatory Visit: Payer: Self-pay | Admitting: Psychiatry

## 2019-03-18 DIAGNOSIS — F1021 Alcohol dependence, in remission: Secondary | ICD-10-CM

## 2019-03-18 DIAGNOSIS — F410 Panic disorder [episodic paroxysmal anxiety] without agoraphobia: Secondary | ICD-10-CM

## 2019-03-18 DIAGNOSIS — F429 Obsessive-compulsive disorder, unspecified: Secondary | ICD-10-CM

## 2019-03-20 ENCOUNTER — Encounter: Payer: Self-pay | Admitting: Psychiatry

## 2019-03-20 ENCOUNTER — Ambulatory Visit (INDEPENDENT_AMBULATORY_CARE_PROVIDER_SITE_OTHER): Payer: 59 | Admitting: Psychiatry

## 2019-03-20 ENCOUNTER — Other Ambulatory Visit: Payer: Self-pay

## 2019-03-20 DIAGNOSIS — F1021 Alcohol dependence, in remission: Secondary | ICD-10-CM

## 2019-03-20 DIAGNOSIS — F429 Obsessive-compulsive disorder, unspecified: Secondary | ICD-10-CM

## 2019-03-20 DIAGNOSIS — F410 Panic disorder [episodic paroxysmal anxiety] without agoraphobia: Secondary | ICD-10-CM

## 2019-03-20 MED ORDER — VENLAFAXINE HCL ER 37.5 MG PO CP24: ORAL_CAPSULE | ORAL | 1 refills | Status: DC

## 2019-03-20 NOTE — Progress Notes (Signed)
Virtual Visit via Telephone Note  I connected with Janice Mayo on 03/20/19 at  4:30 PM EST by telephone and verified that I am speaking with the correct person using two identifiers.   I discussed the limitations, risks, security and privacy concerns of performing an evaluation and management service by telephone and the availability of in person appointments. I also discussed with the patient that there may be a patient responsible charge related to this service. The patient expressed understanding and agreed to proceed.   I discussed the assessment and treatment plan with the patient. The patient was provided an opportunity to ask questions and all were answered. The patient agreed with the plan and demonstrated an understanding of the instructions.   The patient was advised to call back or seek an in-person evaluation if the symptoms worsen or if the condition fails to improve as anticipated.   [facility] MD OP Progress Note  03/20/2019 4:46 PM Janice Mayo  MRN:  [id number]  Chief Complaint:  Chief Complaint    Follow-up     HPI: Janice Mayo is a 53 year old Caucasian female, single, employed, lives in [place], has a history of OCD, MDD, panic attacks, alcohol use disorder, scleroderma was evaluated by phone today.  Patient today reports she is currently doing well on the current medication regimen.  She reports her mood symptoms are stable.  She denies any current obsessions or compulsions.  She reports sleep continues to be good.  Her appetite continues to be fair.  She continues to stay away from alcohol.  She continues to be in psychotherapy with her therapist Dr. Genene Churn on a weekly basis.  She reports therapy as helpful.  Reports she she has been spending more time with her family and that has been helpful.  She denies suicidality, homicidality or perceptual disturbances. Visit Diagnosis:    ICD-10-CM   1. Obsessive-compulsive disorder with good or fair insight  F42.9  venlafaxine XR (EFFEXOR-XR) 37.5 MG 24 hr capsule   stable  2. Panic disorder  F41.0   3. Alcohol use disorder, moderate, in sustained remission ([facility])  F10.21     Past Psychiatric History: I have reviewed past psychiatric history from my progress note on 02/21/2018.  Past trials of Prozac, Klonopin, Ativan, venlafaxine.    Past Medical History:  Past Medical History:  Diagnosis Date  . Acute renal failure syndrome ([facility])   . Chronic kidney disease   . Chronic pulmonary heart disease ([facility])   . Diffuse spasm of esophagus   . Lung disease with systemic sclerosis ([facility])   . Obsessive-compulsive disorder   . Osteoporosis   . Raynaud's disease   . Systemic scleroses ([facility])    History reviewed. No pertinent surgical history.  Family Psychiatric History: I have reviewed family psychiatric history from my progress note on 02/21/2018.  Family History:  Family History  Problem Relation Age of Onset  . Anxiety disorder Sister   . Anxiety disorder Brother     Social History: Reviewed social history from my progress note on 02/21/2018. Social History   Socioeconomic History  . Marital status: Single    Spouse name: Not on file  . Number of children: 0  . Years of education: Not on file  . Highest education level: High school graduate  Occupational History  . Not on file  Tobacco Use  . Smoking status: Never Smoker  . Smokeless tobacco: Never Used  Substance and Sexual Activity  . Alcohol use: Yes  Alcohol/week: 20.0 standard drinks    Types: 20 Shots of liquor per week  . Drug use: Not Currently  . Sexual activity: Not Currently  Other Topics Concern  . Not on file  Social History Narrative  . Not on file   Social Determinants of Health   Financial Resource Strain:   . Difficulty of Paying Living Expenses: Not on file  Food Insecurity:   . Worried About Programme researcher, broadcasting/film/videounning Out of Food in the Last Year: Not on file  . Ran Out of Food in the Last Year: Not on file  Transportation  Needs:   . Lack of Transportation (Medical): Not on file  . Lack of Transportation (Non-Medical): Not on file  Physical Activity:   . Days of Exercise per Week: Not on file  . Minutes of Exercise per Session: Not on file  Stress:   . Feeling of Stress : Not on file  Social Connections:   . Frequency of Communication with Friends and Family: Not on file  . Frequency of Social Gatherings with Friends and Family: Not on file  . Attends Religious Services: Not on file  . Active Member of Clubs or Organizations: Not on file  . Attends BankerClub or Organization Meetings: Not on file  . Marital Status: Not on file    Allergies:  Allergies  Allergen Reactions  . Sulfasalazine Hives  . Erythromycin Hives  . Hydroxychloroquine Hives  . Hydroxychloroquine Sulfate   . Septra [Sulfamethoxazole-Trimethoprim] Hives  . Sulfa Antibiotics Hives  . Tetracyclines & Related Hives  . Trimethoprim Other (See Comments)  . Simvastatin Rash    Metabolic Disorder Labs: No results found for: HGBA1C, MPG No results found for: PROLACTIN No results found for: CHOL, TRIG, HDL, CHOLHDL, VLDL, LDLCALC No results found for: TSH  Therapeutic Level Labs: No results found for: LITHIUM No results found for: VALPROATE No components found for:  CBMZ  Current Medications: Current Outpatient Medications  Medication Sig Dispense Refill  . metoprolol tartrate (LOPRESSOR) 25 MG tablet Take by mouth.    . captopril (CAPOTEN) 100 MG tablet Take by mouth.    . captopril (CAPOTEN) 100 MG tablet     . Cholecalciferol (VITAMIN D-1000 MAX ST) 25 MCG (1000 UT) tablet Take by mouth.    . denosumab (PROLIA) 60 MG/ML SOSY injection every 6 (six) months    . gabapentin (NEURONTIN) 100 MG capsule TAKE 2 CAPSULES BY MOUTH AT BEDTIME 180 capsule 1  . Multiple Vitamins-Minerals (CENTRUM FLAVOR BURST ADULT) CHEW Chew by mouth.    . mycophenolate (CELLCEPT) 500 MG tablet Take by mouth.    . mycophenolate (CELLCEPT) 500 MG tablet  2,000 mg. bid    . NIFEdipine (PROCARDIA XL/NIFEDICAL XL) 60 MG 24 hr tablet Take by mouth.    Marland Kitchen. omeprazole (PRILOSEC) 20 MG capsule Take by mouth.    . traZODone (DESYREL) 50 MG tablet Take 0.5-1 tablets (25-50 mg total) by mouth at bedtime. For sleep 30 tablet 1  . venlafaxine XR (EFFEXOR-XR) 150 MG 24 hr capsule TAKE 1 CAPSULE BY MOUTH ONCE DAILY WITH BREAKFAST WITH 37.5MG  CAPSULE 90 capsule 1  . venlafaxine XR (EFFEXOR-XR) 37.5 MG 24 hr capsule TAKE 1 CAPSULE BY MOUTH ONCE DAILY WITH BREAKFAST WITH 150MG  CAPS 90 capsule 1   No current facility-administered medications for this visit.     Musculoskeletal: Strength & Muscle Tone: UTA Gait & Station: Reports as WNL Patient leans: N/A  Psychiatric Specialty Exam: Review of Systems  Psychiatric/Behavioral: Negative for agitation, behavioral  problems, confusion, decreased concentration, dysphoric mood, hallucinations, self-injury, sleep disturbance and suicidal ideas. The patient is not nervous/anxious and is not hyperactive.   All other systems reviewed and are negative.   There were no vitals taken for this visit.There is no height or weight on file to calculate BMI.  General Appearance: UTA  Eye Contact:  UTA  Speech:  Clear and Coherent  Volume:  Normal  Mood:  Euthymic  Affect:  UTA  Thought Process:  Goal Directed and Descriptions of Associations: Intact  Orientation:  Full (Time, Place, and Person)  Thought Content: Logical   Suicidal Thoughts:  No  Homicidal Thoughts:  No  Memory:  Immediate;   Fair Recent;   Good Remote;   Fair  Judgement:  Fair  Insight:  Fair  Psychomotor Activity:  UTA  Concentration:  Concentration: Fair and Attention Span: Fair  Recall:  [employer] of Knowledge: Fair  Language: Fair  Akathisia:  No  Handed:  Right  AIMS (if indicated): Denies tremors, rigidity  Assets:  Communication Skills Desire for Improvement Social Support  ADL's:  Intact  Cognition: WNL  Sleep:  Fair    Screenings:   Assessment and Plan: Janice Mayo is a 53 year old Caucasian female, single, has a history of OCD, panic attacks, MDD, alcohol use disorder, scleroderma, pulmonary hypertension was evaluated by phone today.  She is biologically predisposed given her history of alcoholism as well as family history of mental health problems.  She also has psychosocial stressors of her own chronic mental health issues, father's death, pandemic.  Patient however is currently sober from alcohol abuse and reports mood symptoms are stable.  She will continue to benefit from medications as well as psychotherapy sessions.  Plan as noted below.  Plan OCD-stable Venlafaxine 187.5 mg p.o. daily. Continue CBT with Dr. Andrey Spearman.  Panic attacks-stable She will continue CBT  Alcohol use disorder in sustained remission Patient has been sober since December 2019. Gabapentin 200 mg p.o. nightly.   Insomnia-stable Continue to monitor closely  Bereavement-stable She will continue CBT.  Follow-up in clinic in 3 to 4 months or sooner if needed.  April 15 at 4:40 PM  I have spent atleast 15 minutes non face to face with patient today. More than 50 % of the time was spent for psychoeducation and supportive psychotherapy and care coordination. This note was generated in part or whole with voice recognition software. Voice recognition is usually quite accurate but there are transcription errors that can and very often do occur. I apologize for any typographical errors that were not detected and corrected.      Jomarie Longs, MD 03/20/2019, 4:46 PM

## 2019-07-18 ENCOUNTER — Encounter: Payer: Self-pay | Admitting: Psychiatry

## 2019-07-18 ENCOUNTER — Ambulatory Visit (INDEPENDENT_AMBULATORY_CARE_PROVIDER_SITE_OTHER): Payer: 59 | Admitting: Psychiatry

## 2019-07-18 ENCOUNTER — Other Ambulatory Visit: Payer: Self-pay

## 2019-07-18 DIAGNOSIS — F1021 Alcohol dependence, in remission: Secondary | ICD-10-CM | POA: Diagnosis not present

## 2019-07-18 DIAGNOSIS — F410 Panic disorder [episodic paroxysmal anxiety] without agoraphobia: Secondary | ICD-10-CM

## 2019-07-18 DIAGNOSIS — F429 Obsessive-compulsive disorder, unspecified: Secondary | ICD-10-CM | POA: Diagnosis not present

## 2019-07-18 NOTE — Patient Instructions (Signed)
Follow-up in clinic on September 16 at 4:40 PM

## 2019-07-18 NOTE — Progress Notes (Signed)
Provider Location : [facility] Patient Location : [facility]  Virtual Visit via Telephone Note  I connected with Janice Mayo on 07/18/19 at  4:40 PM EDT by telephone and verified that I am speaking with the correct person using two identifiers.   I discussed the limitations, risks, security and privacy concerns of performing an evaluation and management service by telephone and the availability of in person appointments. I also discussed with the patient that there may be a patient responsible charge related to this service. The patient expressed understanding and agreed to proceed.    I discussed the assessment and treatment plan with the patient. The patient was provided an opportunity to ask questions and all were answered. The patient agreed with the plan and demonstrated an understanding of the instructions.   The patient was advised to call back or seek an in-person evaluation if the symptoms worsen or if the condition fails to improve as anticipated. [facility] MD OP Progress Note  07/18/2019 6:37 PM Janice Mayo  MRN:  [id number]  Chief Complaint:  Chief Complaint    Follow-up     HPI: Janice Mayo is a 54 year old Caucasian female, single, employed, lives in [place], has a history of OCD, MDD, panic attacks, alcohol use disorder, scleroderma was evaluated by phone today.  Patient today reports she is currently doing well on the current medication regimen.  She reports her obsessions and compulsions are currently stable.  She denies any sadness or crying spells.  She reports sleep continues to be good.  She continues to stay away from alcohol.  She reports work is going well.  She continues to have good social support system.  Patient denies any suicidality, homicidality or perceptual disturbances.   Visit Diagnosis:    ICD-10-CM   1. Obsessive-compulsive disorder with good or fair insight  F42.9   2. Panic disorder  F41.0   3. Alcohol use disorder, moderate, in sustained  remission (HCC)  F10.21     Past Psychiatric History: I have reviewed past psychiatric history from my progress note on 02/21/2018.  Past trials of Prozac, Klonopin, Ativan, venlafaxine.  Past Medical History:  Past Medical History:  Diagnosis Date  . Acute renal failure syndrome (HCC)   . Chronic kidney disease   . Chronic pulmonary heart disease (HCC)   . Diffuse spasm of esophagus   . Lung disease with systemic sclerosis (HCC)   . Obsessive-compulsive disorder   . Osteoporosis   . Raynaud's disease   . Systemic scleroses (HCC)    History reviewed. No pertinent surgical history.  Family Psychiatric History: I have reviewed family psychiatric history from my progress note on 02/21/2018.  Family History:  Family History  Problem Relation Age of Onset  . Anxiety disorder Sister   . Anxiety disorder Brother     Social History: I have reviewed social history from my progress note on 02/21/2018. Social History   Socioeconomic History  . Marital status: Single    Spouse name: Not on file  . Number of children: 0  . Years of education: Not on file  . Highest education level: High school graduate  Occupational History  . Not on file  Tobacco Use  . Smoking status: Never Smoker  . Smokeless tobacco: Never Used  Substance and Sexual Activity  . Alcohol use: Yes    Alcohol/week: 20.0 standard drinks    Types: 20 Shots of liquor per week  . Drug use: Not Currently  . Sexual activity: Not Currently  Other Topics Concern  . Not on file  Social History Narrative  . Not on file   Social Determinants of Health   Financial Resource Strain:   . Difficulty of Paying Living Expenses:   Food Insecurity:   . Worried About Programme researcher, broadcasting/film/video in the Last Year:   . Barista in the Last Year:   Transportation Needs:   . Freight forwarder (Medical):   Marland Kitchen Lack of Transportation (Non-Medical):   Physical Activity:   . Days of Exercise per Week:   . Minutes of Exercise  per Session:   Stress:   . Feeling of Stress :   Social Connections:   . Frequency of Communication with Friends and Family:   . Frequency of Social Gatherings with Friends and Family:   . Attends Religious Services:   . Active Member of Clubs or Organizations:   . Attends Banker Meetings:   Marland Kitchen Marital Status:     Allergies:  Allergies  Allergen Reactions  . Sulfasalazine Hives  . Erythromycin Hives  . Hydroxychloroquine Hives  . Hydroxychloroquine Sulfate   . Septra [Sulfamethoxazole-Trimethoprim] Hives  . Sulfa Antibiotics Hives  . Tetracyclines & Related Hives  . Trimethoprim Other (See Comments)  . Simvastatin Rash    Metabolic Disorder Labs: No results found for: HGBA1C, MPG No results found for: PROLACTIN No results found for: CHOL, TRIG, HDL, CHOLHDL, VLDL, LDLCALC No results found for: TSH  Therapeutic Level Labs: No results found for: LITHIUM No results found for: VALPROATE No components found for:  CBMZ  Current Medications: Current Outpatient Medications  Medication Sig Dispense Refill  . captopril (CAPOTEN) 100 MG tablet Take by mouth.    . captopril (CAPOTEN) 100 MG tablet     . Cholecalciferol (VITAMIN D-1000 MAX ST) 25 MCG (1000 UT) tablet Take by mouth.    . denosumab (PROLIA) 60 MG/ML SOSY injection every 6 (six) months    . gabapentin (NEURONTIN) 100 MG capsule TAKE 2 CAPSULES BY MOUTH AT BEDTIME 180 capsule 1  . metoprolol tartrate (LOPRESSOR) 25 MG tablet Take by mouth.    . Multiple Vitamins-Minerals (CENTRUM FLAVOR BURST ADULT) CHEW Chew by mouth.    . mycophenolate (CELLCEPT) 500 MG tablet Take by mouth.    . mycophenolate (CELLCEPT) 500 MG tablet 2,000 mg. bid    . NIFEdipine (PROCARDIA XL/NIFEDICAL XL) 60 MG 24 hr tablet Take by mouth.    Marland Kitchen omeprazole (PRILOSEC) 20 MG capsule Take by mouth.    . traZODone (DESYREL) 50 MG tablet Take 0.5-1 tablets (25-50 mg total) by mouth at bedtime. For sleep 30 tablet 1  . venlafaxine XR  (EFFEXOR-XR) 150 MG 24 hr capsule TAKE 1 CAPSULE BY MOUTH ONCE DAILY WITH BREAKFAST WITH 37.5MG  CAPSULE 90 capsule 1  . venlafaxine XR (EFFEXOR-XR) 37.5 MG 24 hr capsule TAKE 1 CAPSULE BY MOUTH ONCE DAILY WITH BREAKFAST WITH 150MG  CAPS 90 capsule 1   No current facility-administered medications for this visit.     Musculoskeletal: Strength & Muscle Tone: UTA Gait & Station: UTA Patient leans: N/A  Psychiatric Specialty Exam: Review of Systems  Psychiatric/Behavioral: Negative for agitation, behavioral problems, confusion, decreased concentration, dysphoric mood, hallucinations, self-injury, sleep disturbance and suicidal ideas. The patient is not nervous/anxious and is not hyperactive.   All other systems reviewed and are negative.   There were no vitals taken for this visit.There is no height or weight on file to calculate BMI.  General Appearance: UTA  Eye  Contact:  UTA  Speech:  Clear and Coherent  Volume:  Normal  Mood:  Euthymic  Affect:  UTA  Thought Process:  Goal Directed and Descriptions of Associations: Intact  Orientation:  Full (Time, Place, and Person)  Thought Content: Logical   Suicidal Thoughts:  No  Homicidal Thoughts:  No  Memory:  Immediate;   Fair Recent;   Fair Remote;   Fair  Judgement:  Fair  Insight:  Fair  Psychomotor Activity:  UTA  Concentration:  Concentration: Fair and Attention Span: Fair  Recall:  [employer] of Knowledge: Fair  Language: Fair  Akathisia:  No  Handed:  Right  AIMS (if indicated):UTA  Assets:  Communication Skills Desire for Improvement Housing Social Support Talents/Skills Transportation Vocational/Educational  ADL's:  Intact  Cognition: WNL  Sleep:  Fair   Screenings:   Assessment and Plan: Janice Mayo is a 54 year old Caucasian female, single, has a history of OCD, panic attacks, MDD, alcohol use disorder, scleroderma, pulmonary hypertension was evaluated by phone today.  Patient is biologically predisposed given her  history of alcoholism as well as family history of mental health problems.  She has psychosocial stressors of her own health issues, father's death and the pandemic.  Patient however has very good support system and is currently stable on medications and continues to stay sober from alcohol.  Plan as noted below.  Plan OCD-stable Venlafaxine 187.5 mg p.o. daily Continue CBT with Dr. Curt Bears  Panic attacks-stable Continue CBT  Alcohol use disorder in sustained remission Patient continues to stay sober.  Last use was in December 2019. Gabapentin 200 mg p.o. nightly  Insomnia-stable Continue to monitor closely  Bereavement-stable She will continue CBT  Follow-up in clinic in 4 to 5 months or sooner if needed.  I have spent atleast 20 minutes non face to face with patient today. More than 50 % of the time was spent for preparing to see the patient ( e.g., review of test, records ), ordering medications and test ,psychoeducation and supportive psychotherapy and care coordination,as well as documenting clinical information in electronic health record. This note was generated in part or whole with voice recognition software. Voice recognition is usually quite accurate but there are transcription errors that can and very often do occur. I apologize for any typographical errors that were not detected and corrected.       Ursula Alert, MD 07/18/2019, 6:37 PM

## 2019-09-18 ENCOUNTER — Telehealth: Payer: Self-pay

## 2019-09-18 DIAGNOSIS — F429 Obsessive-compulsive disorder, unspecified: Secondary | ICD-10-CM

## 2019-09-18 DIAGNOSIS — F410 Panic disorder [episodic paroxysmal anxiety] without agoraphobia: Secondary | ICD-10-CM

## 2019-09-18 MED ORDER — VENLAFAXINE HCL ER 37.5 MG PO CP24: ORAL_CAPSULE | ORAL | 0 refills | Status: AC

## 2019-09-18 MED ORDER — VENLAFAXINE HCL ER 150 MG PO CP24: 150.0000 mg | ORAL_CAPSULE | Freq: Every day | ORAL | 0 refills | Status: AC

## 2019-09-18 NOTE — Telephone Encounter (Signed)
30 days rx sent

## 2019-09-18 NOTE — Addendum Note (Signed)
Addended by: Lorenso Quarry on: 09/18/2019 02:00 PM   Modules accepted: Orders

## 2019-09-18 NOTE — Telephone Encounter (Signed)
received fax requesting a refill on venlafaxine hcl er 150mg   for a 90 day supply   venlafaxine XR (EFFEXOR-XR) 150 MG 24 hr capsule Medication Date: 02/22/2019 Department: [facility] Ordering/Authorizing: [facility], MD  Order Providers  Prescribing Provider Encounter Provider  Jomarie Longs, MD Jomarie Longs, MD  Outpatient Medication Detail   Disp Refills Start End   venlafaxine XR (EFFEXOR-XR) 150 MG 24 hr capsule 90 capsule 1 02/22/2019    Sig: TAKE 1 CAPSULE BY MOUTH ONCE DAILY WITH BREAKFAST WITH 37.5MG  CAPSULE   Sent to pharmacy as: venlafaxine XR (EFFEXOR-XR) 150 MG 24 hr capsule   E-Prescribing Status: Receipt confirmed by pharmacy (02/22/2019 12:40 PM EST)

## 2019-09-21 ENCOUNTER — Other Ambulatory Visit: Payer: Self-pay | Admitting: Psychiatry

## 2019-09-21 DIAGNOSIS — F410 Panic disorder [episodic paroxysmal anxiety] without agoraphobia: Secondary | ICD-10-CM

## 2019-09-21 DIAGNOSIS — F429 Obsessive-compulsive disorder, unspecified: Secondary | ICD-10-CM

## 2019-09-21 DIAGNOSIS — F1021 Alcohol dependence, in remission: Secondary | ICD-10-CM

## 2019-12-19 ENCOUNTER — Telehealth: Payer: 59 | Admitting: Psychiatry

## 2020-05-23 ENCOUNTER — Other Ambulatory Visit: Payer: Self-pay

## 2020-05-23 ENCOUNTER — Inpatient Hospital Stay: Payer: 59

## 2020-05-23 ENCOUNTER — Emergency Department: Payer: 59

## 2020-05-23 ENCOUNTER — Inpatient Hospital Stay
Admission: EM | Admit: 2020-05-23 | Discharge: 2020-06-02 | DRG: 208 | Disposition: E | Payer: 59 | Attending: Pulmonary Disease | Admitting: Pulmonary Disease

## 2020-05-23 DIAGNOSIS — Z66 Do not resuscitate: Secondary | ICD-10-CM | POA: Diagnosis not present

## 2020-05-23 DIAGNOSIS — E038 Other specified hypothyroidism: Secondary | ICD-10-CM | POA: Diagnosis present

## 2020-05-23 DIAGNOSIS — N189 Chronic kidney disease, unspecified: Secondary | ICD-10-CM | POA: Diagnosis present

## 2020-05-23 DIAGNOSIS — I959 Hypotension, unspecified: Secondary | ICD-10-CM | POA: Diagnosis not present

## 2020-05-23 DIAGNOSIS — E876 Hypokalemia: Secondary | ICD-10-CM | POA: Diagnosis present

## 2020-05-23 DIAGNOSIS — E875 Hyperkalemia: Secondary | ICD-10-CM | POA: Diagnosis not present

## 2020-05-23 DIAGNOSIS — I272 Pulmonary hypertension, unspecified: Secondary | ICD-10-CM | POA: Diagnosis present

## 2020-05-23 DIAGNOSIS — F419 Anxiety disorder, unspecified: Secondary | ICD-10-CM | POA: Diagnosis present

## 2020-05-23 DIAGNOSIS — Z818 Family history of other mental and behavioral disorders: Secondary | ICD-10-CM

## 2020-05-23 DIAGNOSIS — Z9981 Dependence on supplemental oxygen: Secondary | ICD-10-CM | POA: Diagnosis not present

## 2020-05-23 DIAGNOSIS — R739 Hyperglycemia, unspecified: Secondary | ICD-10-CM | POA: Diagnosis not present

## 2020-05-23 DIAGNOSIS — J80 Acute respiratory distress syndrome: Secondary | ICD-10-CM | POA: Diagnosis present

## 2020-05-23 DIAGNOSIS — U071 COVID-19: Principal | ICD-10-CM | POA: Diagnosis present

## 2020-05-23 DIAGNOSIS — E871 Hypo-osmolality and hyponatremia: Secondary | ICD-10-CM | POA: Diagnosis not present

## 2020-05-23 DIAGNOSIS — Z881 Allergy status to other antibiotic agents status: Secondary | ICD-10-CM

## 2020-05-23 DIAGNOSIS — I129 Hypertensive chronic kidney disease with stage 1 through stage 4 chronic kidney disease, or unspecified chronic kidney disease: Secondary | ICD-10-CM | POA: Diagnosis present

## 2020-05-23 DIAGNOSIS — Z452 Encounter for adjustment and management of vascular access device: Secondary | ICD-10-CM

## 2020-05-23 DIAGNOSIS — J9621 Acute and chronic respiratory failure with hypoxia: Secondary | ICD-10-CM | POA: Diagnosis present

## 2020-05-23 DIAGNOSIS — R0602 Shortness of breath: Secondary | ICD-10-CM | POA: Diagnosis present

## 2020-05-23 DIAGNOSIS — F32A Depression, unspecified: Secondary | ICD-10-CM | POA: Diagnosis present

## 2020-05-23 DIAGNOSIS — J9690 Respiratory failure, unspecified, unspecified whether with hypoxia or hypercapnia: Secondary | ICD-10-CM

## 2020-05-23 DIAGNOSIS — J9600 Acute respiratory failure, unspecified whether with hypoxia or hypercapnia: Secondary | ICD-10-CM

## 2020-05-23 DIAGNOSIS — Z515 Encounter for palliative care: Secondary | ICD-10-CM | POA: Diagnosis not present

## 2020-05-23 DIAGNOSIS — Z79899 Other long term (current) drug therapy: Secondary | ICD-10-CM

## 2020-05-23 DIAGNOSIS — D638 Anemia in other chronic diseases classified elsewhere: Secondary | ICD-10-CM | POA: Diagnosis not present

## 2020-05-23 DIAGNOSIS — I270 Primary pulmonary hypertension: Secondary | ICD-10-CM | POA: Diagnosis not present

## 2020-05-23 DIAGNOSIS — N179 Acute kidney failure, unspecified: Secondary | ICD-10-CM | POA: Diagnosis not present

## 2020-05-23 DIAGNOSIS — J8410 Pulmonary fibrosis, unspecified: Secondary | ICD-10-CM | POA: Diagnosis present

## 2020-05-23 DIAGNOSIS — E861 Hypovolemia: Secondary | ICD-10-CM | POA: Diagnosis not present

## 2020-05-23 DIAGNOSIS — J1282 Pneumonia due to coronavirus disease 2019: Secondary | ICD-10-CM | POA: Diagnosis present

## 2020-05-23 DIAGNOSIS — F1020 Alcohol dependence, uncomplicated: Secondary | ICD-10-CM | POA: Diagnosis present

## 2020-05-23 DIAGNOSIS — I7300 Raynaud's syndrome without gangrene: Secondary | ICD-10-CM | POA: Diagnosis present

## 2020-05-23 DIAGNOSIS — F429 Obsessive-compulsive disorder, unspecified: Secondary | ICD-10-CM | POA: Diagnosis present

## 2020-05-23 DIAGNOSIS — M810 Age-related osteoporosis without current pathological fracture: Secondary | ICD-10-CM | POA: Diagnosis present

## 2020-05-23 DIAGNOSIS — I9589 Other hypotension: Secondary | ICD-10-CM | POA: Diagnosis not present

## 2020-05-23 DIAGNOSIS — E869 Volume depletion, unspecified: Secondary | ICD-10-CM | POA: Diagnosis not present

## 2020-05-23 DIAGNOSIS — M3481 Systemic sclerosis with lung involvement: Secondary | ICD-10-CM | POA: Diagnosis present

## 2020-05-23 DIAGNOSIS — I10 Essential (primary) hypertension: Secondary | ICD-10-CM | POA: Diagnosis present

## 2020-05-23 DIAGNOSIS — Z882 Allergy status to sulfonamides status: Secondary | ICD-10-CM

## 2020-05-23 DIAGNOSIS — Z888 Allergy status to other drugs, medicaments and biological substances status: Secondary | ICD-10-CM

## 2020-05-23 DIAGNOSIS — J9620 Acute and chronic respiratory failure, unspecified whether with hypoxia or hypercapnia: Secondary | ICD-10-CM | POA: Diagnosis present

## 2020-05-23 LAB — CBC WITH DIFFERENTIAL/PLATELET
Abs Immature Granulocytes: 0.08 10*3/uL — ABNORMAL HIGH (ref 0.00–0.07)
Basophils Absolute: 0 10*3/uL (ref 0.0–0.1)
Basophils Relative: 0 %
Eosinophils Absolute: 0.1 10*3/uL (ref 0.0–0.5)
Eosinophils Relative: 1 %
HCT: 34.1 % — ABNORMAL LOW (ref 36.0–46.0)
Hemoglobin: 11.3 g/dL — ABNORMAL LOW (ref 12.0–15.0)
Immature Granulocytes: 1 %
Lymphocytes Relative: 4 %
Lymphs Abs: 0.6 10*3/uL — ABNORMAL LOW (ref 0.7–4.0)
MCH: 30.5 pg (ref 26.0–34.0)
MCHC: 33.1 g/dL (ref 30.0–36.0)
MCV: 91.9 fL (ref 80.0–100.0)
Monocytes Absolute: 0.8 10*3/uL (ref 0.1–1.0)
Monocytes Relative: 6 %
Neutro Abs: 13.2 10*3/uL — ABNORMAL HIGH (ref 1.7–7.7)
Neutrophils Relative %: 88 %
Platelets: 285 10*3/uL (ref 150–400)
RBC: 3.71 MIL/uL — ABNORMAL LOW (ref 3.87–5.11)
RDW: 12.9 % (ref 11.5–15.5)
WBC: 14.8 10*3/uL — ABNORMAL HIGH (ref 4.0–10.5)
nRBC: 0 % (ref 0.0–0.2)

## 2020-05-23 LAB — POC SARS CORONAVIRUS 2 AG -  ED: SARS Coronavirus 2 Ag: NEGATIVE

## 2020-05-23 LAB — SARS CORONAVIRUS 2 (TAT 6-24 HRS): SARS Coronavirus 2: POSITIVE — AB

## 2020-05-23 LAB — BLOOD GAS, ARTERIAL
Acid-base deficit: 3.8 mmol/L — ABNORMAL HIGH (ref 0.0–2.0)
Acid-base deficit: 1.7 mmol/L (ref 0.0–2.0)
Allens test (pass/fail): POSITIVE — AB
Bicarbonate: 21.5 mmol/L (ref 20.0–28.0)
Bicarbonate: 23.7 mmol/L (ref 20.0–28.0)
Delivery systems: POSITIVE
Expiratory PAP: 6
FIO2: 0.8
FIO2: 1
Inspiratory PAP: 12
MECHVT: 380 mL
Mechanical Rate: 30
O2 Saturation: 92.9 %
O2 Saturation: 99.4 %
PEEP: 10 cmH2O
Patient temperature: 37
Patient temperature: 37
RATE: 10 resp/min
RATE: 30 resp/min
pCO2 arterial: 39 mmHg (ref 32.0–48.0)
pCO2 arterial: 42 mmHg (ref 32.0–48.0)
pH, Arterial: 7.35 (ref 7.350–7.450)
pH, Arterial: 7.36 (ref 7.350–7.450)
pO2, Arterial: 70 mmHg — ABNORMAL LOW (ref 83.0–108.0)
pO2, Arterial: 166 mmHg — ABNORMAL HIGH (ref 83.0–108.0)

## 2020-05-23 LAB — TROPONIN I (HIGH SENSITIVITY)
Troponin I (High Sensitivity): 8 ng/L (ref <18)
Troponin I (High Sensitivity): 6 ng/L (ref <18)

## 2020-05-23 LAB — COMPREHENSIVE METABOLIC PANEL
ALT: 7 U/L (ref 0–44)
AST: 30 U/L (ref 15–41)
Albumin: 3.2 g/dL — ABNORMAL LOW (ref 3.5–5.0)
Alkaline Phosphatase: 83 U/L (ref 38–126)
Anion gap: 13 (ref 5–15)
BUN: 8 mg/dL (ref 6–20)
CO2: 20 mmol/L — ABNORMAL LOW (ref 22–32)
Calcium: 8.2 mg/dL — ABNORMAL LOW (ref 8.9–10.3)
Chloride: 95 mmol/L — ABNORMAL LOW (ref 98–111)
Creatinine, Ser: 0.64 mg/dL (ref 0.44–1.00)
GFR, Estimated: >60 mL/min (ref >60)
Glucose, Bld: 118 mg/dL — ABNORMAL HIGH (ref 70–99)
Potassium: 3 mmol/L — ABNORMAL LOW (ref 3.5–5.1)
Sodium: 128 mmol/L — ABNORMAL LOW (ref 135–145)
Total Bilirubin: 0.8 mg/dL (ref 0.3–1.2)
Total Protein: 7.4 g/dL (ref 6.5–8.1)

## 2020-05-23 LAB — MRSA PCR SCREENING: MRSA by PCR: NEGATIVE

## 2020-05-23 LAB — BRAIN NATRIURETIC PEPTIDE: B Natriuretic Peptide: 414 pg/mL — ABNORMAL HIGH (ref 0.0–100.0)

## 2020-05-23 LAB — GLUCOSE, CAPILLARY: Glucose-Capillary: 153 mg/dL — ABNORMAL HIGH (ref 70–99)

## 2020-05-23 LAB — D-DIMER, QUANTITATIVE: D-Dimer, Quant: 5.17 ug/mL-FEU — ABNORMAL HIGH (ref 0.00–0.50)

## 2020-05-23 LAB — PROCALCITONIN: Procalcitonin: 0.19 ng/mL

## 2020-05-23 MED ORDER — METHYLPREDNISOLONE SODIUM SUCC 125 MG IJ SOLR: 125.0000 mg | Freq: Once | INTRAMUSCULAR | Status: AC

## 2020-05-23 MED ORDER — CHLORHEXIDINE GLUCONATE CLOTH 2 % EX PADS
6.0000 | MEDICATED_PAD | Freq: Every day | CUTANEOUS | Status: DC
  Administered 2020-05-24 – 2020-05-26 (×3): 6 via TOPICAL

## 2020-05-23 MED ORDER — LORAZEPAM 2 MG PO TABS
0.0000 mg | ORAL_TABLET | ORAL | Status: DC
  Administered 2020-05-24 (×2): 2 mg via ORAL
  Filled 2020-05-23 (×2): qty 1

## 2020-05-23 MED ORDER — LACTATED RINGERS IV BOLUS
1000.0000 mL | Freq: Once | INTRAVENOUS | Status: AC
  Administered 2020-05-23: 1000 mL via INTRAVENOUS

## 2020-05-23 MED ORDER — BARICITINIB 2 MG PO TABS
2.0000 mg | ORAL_TABLET | Freq: Every day | ORAL | Status: DC
  Administered 2020-05-23: 2 mg via ORAL
  Filled 2020-05-23: qty 1

## 2020-05-23 MED ORDER — ALBUTEROL SULFATE (2.5 MG/3ML) 0.083% IN NEBU
2.5000 mg | INHALATION_SOLUTION | RESPIRATORY_TRACT | Status: DC
  Administered 2020-05-23 – 2020-05-26 (×16): 2.5 mg via RESPIRATORY_TRACT
  Filled 2020-05-23 (×16): qty 3

## 2020-05-23 MED ORDER — SODIUM CHLORIDE 0.9% FLUSH
3.0000 mL | Freq: Two times a day (BID) | INTRAVENOUS | Status: DC
  Administered 2020-05-23 – 2020-05-26 (×8): 3 mL via INTRAVENOUS

## 2020-05-23 MED ORDER — ADULT MULTIVITAMIN W/MINERALS CH: 1.0000 | ORAL_TABLET | Freq: Every day | ORAL | Status: DC

## 2020-05-23 MED ORDER — ENOXAPARIN SODIUM 40 MG/0.4ML ~~LOC~~ SOLN
40.0000 mg | SUBCUTANEOUS | Status: DC
  Administered 2020-05-23 – 2020-05-25 (×3): 40 mg via SUBCUTANEOUS
  Filled 2020-05-23 (×3): qty 0.4

## 2020-05-23 MED ORDER — ONDANSETRON HCL 4 MG/2ML IJ SOLN: 4.0000 mg | Freq: Four times a day (QID) | INTRAMUSCULAR | Status: DC | PRN

## 2020-05-23 MED ORDER — METHYLPREDNISOLONE SODIUM SUCC 125 MG IJ SOLR
1.0000 mg/kg | Freq: Two times a day (BID) | INTRAMUSCULAR | Status: DC
  Administered 2020-05-23 (×2): 69.375 mg via INTRAVENOUS
  Filled 2020-05-23 (×2): qty 2

## 2020-05-23 MED ORDER — CAPTOPRIL 25 MG PO TABS
200.0000 mg | ORAL_TABLET | Freq: Two times a day (BID) | ORAL | Status: DC
Start: 2020-05-23 — End: 2020-05-24
  Administered 2020-05-23: 200 mg via ORAL
  Filled 2020-05-23 (×2): qty 8

## 2020-05-23 MED ORDER — CHLORHEXIDINE GLUCONATE 0.12% ORAL RINSE (MEDLINE KIT)
15.0000 mL | Freq: Two times a day (BID) | OROMUCOSAL | Status: DC
  Administered 2020-05-23 – 2020-05-26 (×6): 15 mL via OROMUCOSAL
  Filled 2020-05-23 (×2): qty 15

## 2020-05-23 MED ORDER — SODIUM CHLORIDE 0.9 % IV SOLN: 250.0000 mL | INTRAVENOUS | Status: DC | PRN

## 2020-05-23 MED ORDER — GUAIFENESIN-DM 100-10 MG/5ML PO SYRP: 10.0000 mL | ORAL_SOLUTION | ORAL | Status: DC | PRN

## 2020-05-23 MED ORDER — LORAZEPAM 2 MG/ML IJ SOLN
1.0000 mg | INTRAMUSCULAR | Status: DC | PRN
  Administered 2020-05-23 (×2): 4 mg via INTRAVENOUS
  Administered 2020-05-23 – 2020-05-24 (×2): 2 mg via INTRAVENOUS
  Filled 2020-05-23: qty 1
  Filled 2020-05-23: qty 2
  Filled 2020-05-23: qty 1
  Filled 2020-05-23: qty 2
  Filled 2020-05-23: qty 1

## 2020-05-23 MED ORDER — TRAZODONE HCL 50 MG PO TABS: 25.0000 mg | ORAL_TABLET | Freq: Every day | ORAL | Status: DC

## 2020-05-23 MED ORDER — FOLIC ACID 1 MG PO TABS: 1.0000 mg | ORAL_TABLET | Freq: Every day | ORAL | Status: DC

## 2020-05-23 MED ORDER — NIFEDIPINE ER 60 MG PO TB24
120.0000 mg | ORAL_TABLET | Freq: Every day | ORAL | Status: DC
  Filled 2020-05-23: qty 2

## 2020-05-23 MED ORDER — LORAZEPAM BOLUS VIA INFUSION: 0.5000 mg | Freq: Once | INTRAVENOUS | Status: DC

## 2020-05-23 MED ORDER — SODIUM CHLORIDE 0.9% FLUSH: 3.0000 mL | INTRAVENOUS | Status: DC | PRN

## 2020-05-23 MED ORDER — POTASSIUM CHLORIDE 2 MEQ/ML IV SOLN
INTRAVENOUS | Status: DC
  Filled 2020-05-23 (×4): qty 1000

## 2020-05-23 MED ORDER — LORAZEPAM 2 MG/ML IJ SOLN: 0.5000 mg | Freq: Once | INTRAMUSCULAR | Status: DC

## 2020-05-23 MED ORDER — PREDNISONE 20 MG PO TABS: 50.0000 mg | ORAL_TABLET | Freq: Every day | ORAL | Status: DC

## 2020-05-23 MED ORDER — LORAZEPAM 2 MG PO TABS: 0.0000 mg | ORAL_TABLET | Freq: Three times a day (TID) | ORAL | Status: DC

## 2020-05-23 MED ORDER — NINTEDANIB ESYLATE 150 MG PO CAPS: 150.0000 mg | ORAL_CAPSULE | Freq: Every day | ORAL | Status: DC

## 2020-05-23 MED ORDER — ETOMIDATE 2 MG/ML IV SOLN
INTRAVENOUS | Status: DC | PRN
Start: 2020-05-23 — End: 2020-05-23
  Administered 2020-05-23: 20 mg via INTRAVENOUS

## 2020-05-23 MED ORDER — THIAMINE HCL 100 MG/ML IJ SOLN: 100.0000 mg | Freq: Every day | INTRAMUSCULAR | Status: DC

## 2020-05-23 MED ORDER — IPRATROPIUM-ALBUTEROL 0.5-2.5 (3) MG/3ML IN SOLN
3.0000 mL | Freq: Once | RESPIRATORY_TRACT | Status: AC
  Administered 2020-05-23: 3 mL via RESPIRATORY_TRACT

## 2020-05-23 MED ORDER — METHYLPREDNISOLONE SODIUM SUCC 125 MG IJ SOLR
INTRAMUSCULAR | Status: AC
  Administered 2020-05-23: 125 mg via INTRAVENOUS
  Filled 2020-05-23: qty 2

## 2020-05-23 MED ORDER — PROPOFOL 1000 MG/100ML IV EMUL
5.0000 ug/kg/min | INTRAVENOUS | Status: DC
  Administered 2020-05-23: 25 ug/kg/min via INTRAVENOUS
  Administered 2020-05-23: 40 ug/kg/min via INTRAVENOUS
  Administered 2020-05-23: 65 ug/kg/min via INTRAVENOUS
  Administered 2020-05-24: 30 ug/kg/min via INTRAVENOUS
  Administered 2020-05-24: 20 ug/kg/min via INTRAVENOUS
  Administered 2020-05-24: 40 ug/kg/min via INTRAVENOUS
  Administered 2020-05-25: 30 ug/kg/min via INTRAVENOUS
  Administered 2020-05-25: 20 ug/kg/min via INTRAVENOUS
  Administered 2020-05-25: 10 ug/kg/min via INTRAVENOUS
  Administered 2020-05-26: 40 ug/kg/min via INTRAVENOUS
  Administered 2020-05-26: 25 ug/kg/min via INTRAVENOUS
  Filled 2020-05-23 (×10): qty 100

## 2020-05-23 MED ORDER — VITAMIN D 25 MCG (1000 UNIT) PO TABS: 1000.0000 [IU] | ORAL_TABLET | Freq: Every day | ORAL | Status: DC

## 2020-05-23 MED ORDER — LORAZEPAM 2 MG/ML IJ SOLN
1.0000 mg | Freq: Once | INTRAMUSCULAR | Status: AC
  Administered 2020-05-23: 1 mg via INTRAVENOUS
  Filled 2020-05-23: qty 1

## 2020-05-23 MED ORDER — ACETAMINOPHEN 325 MG PO TABS
650.0000 mg | ORAL_TABLET | Freq: Four times a day (QID) | ORAL | Status: DC | PRN
  Administered 2020-05-25: 650 mg via ORAL
  Filled 2020-05-23: qty 2

## 2020-05-23 MED ORDER — VECURONIUM BROMIDE 10 MG IV SOLR
10.0000 mg | INTRAVENOUS | Status: DC | PRN
  Administered 2020-05-23 – 2020-05-26 (×4): 10 mg via INTRAVENOUS
  Filled 2020-05-23 (×3): qty 10

## 2020-05-23 MED ORDER — POTASSIUM CHLORIDE 10 MEQ/100ML IV SOLN
10.0000 meq | Freq: Once | INTRAVENOUS | Status: AC
  Administered 2020-05-23: 10 meq via INTRAVENOUS
  Filled 2020-05-23: qty 100

## 2020-05-23 MED ORDER — FENTANYL 2500MCG IN NS 250ML (10MCG/ML) PREMIX INFUSION
0.0000 ug/h | INTRAVENOUS | Status: DC
  Administered 2020-05-23: 200 ug/h via INTRAVENOUS
  Administered 2020-05-24: 150 ug/h via INTRAVENOUS
  Administered 2020-05-24: 250 ug/h via INTRAVENOUS
  Administered 2020-05-25: 25 ug/h via INTRAVENOUS
  Administered 2020-05-25: 125 ug/h via INTRAVENOUS
  Administered 2020-05-26: 300 ug/h via INTRAVENOUS
  Filled 2020-05-23 (×6): qty 250

## 2020-05-23 MED ORDER — MYCOPHENOLATE MOFETIL 250 MG PO CAPS
1000.0000 mg | ORAL_CAPSULE | Freq: Two times a day (BID) | ORAL | Status: DC
  Filled 2020-05-23: qty 4

## 2020-05-23 MED ORDER — GABAPENTIN 100 MG PO CAPS
200.0000 mg | ORAL_CAPSULE | Freq: Every day | ORAL | Status: DC
  Administered 2020-05-23: 200 mg via ORAL
  Filled 2020-05-23: qty 2

## 2020-05-23 MED ORDER — ASCORBIC ACID 500 MG PO TABS
500.0000 mg | ORAL_TABLET | Freq: Every day | ORAL | Status: DC
  Administered 2020-05-23: 500 mg via ORAL
  Filled 2020-05-23: qty 1

## 2020-05-23 MED ORDER — IOHEXOL 350 MG/ML SOLN
75.0000 mL | Freq: Once | INTRAVENOUS | Status: AC | PRN
  Administered 2020-05-23: 75 mL via INTRAVENOUS

## 2020-05-23 MED ORDER — POTASSIUM CHLORIDE CRYS ER 20 MEQ PO TBCR
40.0000 meq | EXTENDED_RELEASE_TABLET | Freq: Once | ORAL | Status: AC
  Administered 2020-05-23: 40 meq via ORAL
  Filled 2020-05-23: qty 2

## 2020-05-23 MED ORDER — THIAMINE HCL 100 MG/ML IJ SOLN
500.0000 mg | INTRAVENOUS | Status: DC
  Administered 2020-05-23 – 2020-05-25 (×3): 500 mg via INTRAVENOUS
  Filled 2020-05-23 (×4): qty 5

## 2020-05-23 MED ORDER — PANTOPRAZOLE SODIUM 40 MG IV SOLR
40.0000 mg | INTRAVENOUS | Status: DC
  Administered 2020-05-24 – 2020-05-26 (×3): 40 mg via INTRAVENOUS
  Filled 2020-05-23 (×3): qty 40

## 2020-05-23 MED ORDER — ONDANSETRON HCL 4 MG PO TABS: 4.0000 mg | ORAL_TABLET | Freq: Four times a day (QID) | ORAL | Status: DC | PRN

## 2020-05-23 MED ORDER — ORAL CARE MOUTH RINSE
15.0000 mL | OROMUCOSAL | Status: DC
  Administered 2020-05-23 – 2020-05-26 (×26): 15 mL via OROMUCOSAL
  Filled 2020-05-23 (×6): qty 15

## 2020-05-23 MED ORDER — ZINC SULFATE 220 (50 ZN) MG PO CAPS
220.0000 mg | ORAL_CAPSULE | Freq: Every day | ORAL | Status: DC
  Administered 2020-05-23: 220 mg via ORAL
  Filled 2020-05-23: qty 1

## 2020-05-23 MED ORDER — VENLAFAXINE HCL ER 75 MG PO CP24: 225.0000 mg | ORAL_CAPSULE | Freq: Every day | ORAL | Status: DC

## 2020-05-23 MED ORDER — LORAZEPAM 1 MG PO TABS
1.0000 mg | ORAL_TABLET | ORAL | Status: DC | PRN
Start: 2020-05-23 — End: 2020-05-24

## 2020-05-23 MED ORDER — PANTOPRAZOLE SODIUM 40 MG PO TBEC: 40.0000 mg | DELAYED_RELEASE_TABLET | Freq: Every day | ORAL | Status: DC

## 2020-05-23 MED ORDER — ROCURONIUM BROMIDE 50 MG/5ML IV SOLN
INTRAVENOUS | Status: DC | PRN
  Administered 2020-05-23: 30 mg via INTRAVENOUS

## 2020-05-23 MED ORDER — VENLAFAXINE HCL ER 75 MG PO CP24
75.0000 mg | ORAL_CAPSULE | Freq: Three times a day (TID) | ORAL | Status: DC
  Administered 2020-05-23: 75 mg via ORAL
  Filled 2020-05-23 (×2): qty 1

## 2020-05-23 MED ORDER — THIAMINE HCL 100 MG PO TABS: 100.0000 mg | ORAL_TABLET | Freq: Every day | ORAL | Status: DC

## 2020-05-23 MED ORDER — MYCOPHENOLATE MOFETIL 500 MG PO TABS: 2000.0000 mg | ORAL_TABLET | Freq: Every day | ORAL | Status: DC

## 2020-05-23 MED ORDER — PROPOFOL 1000 MG/100ML IV EMUL
INTRAVENOUS | Status: AC
  Filled 2020-05-23: qty 100

## 2020-05-23 MED ORDER — METOPROLOL TARTRATE 25 MG PO TABS
12.5000 mg | ORAL_TABLET | Freq: Two times a day (BID) | ORAL | Status: DC
  Administered 2020-05-23 (×2): 12.5 mg via ORAL
  Filled 2020-05-23 (×2): qty 1

## 2020-05-23 MED ORDER — FENTANYL CITRATE (PF) 100 MCG/2ML IJ SOLN
INTRAMUSCULAR | Status: DC | PRN
  Administered 2020-05-23: 50 ug via INTRAVENOUS

## 2020-05-23 MED ORDER — IPRATROPIUM-ALBUTEROL 0.5-2.5 (3) MG/3ML IN SOLN
3.0000 mL | Freq: Once | RESPIRATORY_TRACT | Status: AC
  Administered 2020-05-23: 3 mL via RESPIRATORY_TRACT
  Filled 2020-05-23: qty 9

## 2020-05-23 MED ORDER — POTASSIUM CHLORIDE CRYS ER 20 MEQ PO TBCR
40.0000 meq | EXTENDED_RELEASE_TABLET | Freq: Once | ORAL | Status: DC
  Filled 2020-05-23: qty 2

## 2020-05-23 NOTE — ED Notes (Signed)
Called RT to come look at Bipap machine. Pt breathing 45-50 times per min and stating feels like "can't breathe". RT stated that FiO2 had recently been adjusted and will come see pt.  Messaged Dr Joylene Igo re: pt HR in 130s and RR 45-50 and pt appearing very anxious.

## 2020-05-23 NOTE — ED Notes (Signed)
Pt stating does not understand why Baricitinib prescribed and wanting to refuse. Informed Dr Joylene Igo.

## 2020-05-23 NOTE — ED Notes (Signed)
Attending provider spoke with pt at bedside. Per attending request, pharmacy called to have Venlafaxine sent. Per verbal order, Ativan dose changed to 1mg  (new order entered).

## 2020-05-23 NOTE — Code Documentation (Signed)
Pt is unresponsive to deep sternal rub . Per intensivist we are going to intubate

## 2020-05-23 NOTE — ED Notes (Signed)
Overdue medications still being verified by pharmacy.

## 2020-05-23 NOTE — ED Notes (Signed)
Pt switched to BiPaP upon arrival --12/6/100%/10

## 2020-05-23 NOTE — Procedures (Signed)
Endotracheal Intubation: Patient required emergent intubation due to respiratory failure  Consent: Emergent.   Hand washing performed prior to starting the procedure.   Medications administered for sedation prior to procedure:  Etomidate 20 mg IV, rocuronium 30 mg IV, Fentanyl 50 mcg IV.   Timeout was called and correct patient, name, & ID confirmed. Needed supplies and equipment were assembled and checked to include ETT, 10 ml syringe, Glidescope, Mac and Miller blades, suction, oxygen and bag mask valve, end tidal CO2 monitor.   Patient was positioned to align the mouth and pharynx to facilitate visualization of the glottis.   Heart rate, SpO2 and blood pressure was continuously monitored during the procedure. Pre-oxygenation was conducted prior to intubation.  The patient's mouth opening was limited due to issues with scleroderma. Airway was anterior.  Visualization grade 2 with video laryngoscope.  Cricoid pressure improved visualization.  Endotracheal tube was placed under direct visualization via glidescope with S4 blade, a #7.5 ETT was inserted through the vocal cords on the first attempt.    ETT was secured at 23 cm at the teeth mark.  Placement was confirmed by auscuitation of lungs with good breath sounds bilaterally and no epigastric sounds.  Condensation was noted on endotracheal tube.  Pulse ox 94 %.  CO2 detector in place with appropriate color change.   Patient was connected to mechanical ventilator.  Complications: None apparent.   Operator: Patsey Berthold  Chest radiograph: ET tube and OG tube in good position.  No pneumothorax.  Extensive bilateral airspace disease as prior.    Comments: OGT placed by MD.  Renold Don, MD [facility]   *This note was dictated using voice recognition software/Dragon.  Despite best efforts to proofread, errors can occur which can change the meaning.  Any change was purely unintentional.

## 2020-05-23 NOTE — Code Documentation (Signed)
Hospital and icu physician notified on patients condition pt tacky, desating oxygen and r-rate 50-60

## 2020-05-23 NOTE — ED Notes (Signed)
Overdue meds are still not available in pyxis or pt bin. Pharmacy sending Venlafaxine. Ativan still not showing up in pyxis to withdraw.

## 2020-05-23 NOTE — ED Triage Notes (Signed)
Pt arrives from home via ems -- recent dx: COVID-- reports worsening sob 1 day pta-- h/o scleroderma which also impairs breathing-- states difficulty sleeping-- O2 dependent  On 3lL via [place] with O2 sats 50% per first responders - pt subsequently placed on CPAP enroute with PEEP 12 - O2 sats improved to 93-94% after cpap initiated

## 2020-05-23 NOTE — ED Notes (Signed)
Provided moisturizer to lips and spoonful of ice chips.

## 2020-05-23 NOTE — ED Notes (Signed)
Per Dr Joylene Igo, POC covid swab was collected. Was negative. Now collecting PCR covid.

## 2020-05-23 NOTE — Consult Note (Signed)
Reason for Consult: Assess patient in respiratory failure. Referring Physician: Dr. Royce Macadamia Agbata  Please note that the history is obtained from available records and discussion with Dr. Francine Graven.  Unable to provide history due to obtundation.  Level 5 caveat.   Janice Mayo is an 55 y.o. female.   HPI:  This is an exceedingly complex 55 year old woman with a history of scleroderma with associated pulmonary disease and pulmonary hypertension followed strictly at [facility], she presented today after noting increasing shortness of breath worsening over the last 2 to 3 days prior to her sensation.  She apparently is on oxygen at 2 L/min chronically at home.  The patient tested positive for COVID-19 via home test 9 days prior to presentation.  She was apparently given 5 days of antiviral therapy(Paxlovid) by her outpatient pulmonologist at [facility].  Today morning she developed worsening of her shortness of breath which prompted her to call EMS.  Upon EMS arrival it was noted that the patient was saturating in the 50s on her baseline 2 to 3 L of oxygen.  She was transported to [facility] on CPAP.  The patient noted that she had had ongoing fevers and cough at home when evaluated by the emergency room physician.  She has been vaccinated against COVID and influenza.  On initial evaluation she did not endorse any chest pain, abdominal pain, vomiting or diarrhea.  In the emergency room she was tested for COVID-19 and initial testing was negative.  A PCR is pending.  A chest x-ray showed findings consistent with multifocal pneumonia superimposed on interstitial lung disease.  She was placed on BiPAP with initial improvement, she was noted to have leukocytosis with a left shift.  The patient was initially admitted to the hospitalist service.  The patient however started having difficulties on BiPAP and became very tachypneic and anxious on the device.  Asked to evaluate the patient around 1700 hrs. due to worsening respiratory  status.  On my arrival the patient was obtunded and unresponsive even to deep sternal rub.  She was on BiPAP.  Oxygen saturations were showing to be in the mid 90s.  Proceeded with intubating the patient.  Of note, Dr. Francine Graven reached out to [facility] as the patient gets all her care there.  They stated they did not have beds available and to call back "in a day or 2".  Past Medical History:  Diagnosis Date  . Acute renal failure syndrome ([place])   . Chronic kidney disease   . 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 Diffuse spasm of esophagus   . Lung disease with systemic sclerosis ([place])   . Obsessive-compulsive disorder   . Osteoporosis   . Raynaud's disease   . Systemic scleroses ([place])    Pulmonary hypertension  Patient Active Problem List   Diagnosis Date Noted  . Acute on chronic respiratory failure with hypoxia ([place]) 05/11/2020  . Respiratory failure, acute-on-chronic ([place]) 05/07/2020  . Lung disease with systemic sclerosis ([place])   . Hyponatremia   . Hypokalemia   . Pneumonia due to COVID-19 virus   . Essential hypertension   . Obsessive-compulsive disorder with good or fair insight 12/13/2018  . Panic disorder 12/13/2018  . Alcohol use disorder, moderate, in early remission ([place]) 12/13/2018  . Age-related osteoporosis without current pathological fracture 02/28/2018  . Acute renal failure ([place]) 02/28/2018  . Dyskinesia of esophagus 02/28/2018  . Other chronic pulmonary heart diseases 02/28/2018  . Other injury of unspecified body region 02/28/2018  .  Other long term (current) drug therapy 02/28/2018  . Pleurisy 02/28/2018  . Other specified hypothyroidism 02/28/2018  . Other specified depressive episodes 02/28/2018  . Other secondary pulmonary hypertension ([place]) 02/28/2018  . Other screening mammogram 02/28/2018  . Raynaud's syndrome 02/28/2018  . Postinflammatory pulmonary fibrosis ([place]) 02/28/2018  . 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 Secondary hypertension  02/21/2018  . Pulmonary arterial hypertension ([place]) 11/24/2016  . Mixed obsessional thoughts and acts 08/24/2016  . Palpitations 08/24/2016  . Osteoporosis without current pathological fracture 11/12/2015  . Heavy alcohol consumption 11/03/2014  . Healthcare maintenance 10/31/2014  . Multiple nodules of lung 04/05/2014     No past surgical history on file.  Family History  Problem Relation Age of Onset  . Anxiety disorder Sister   . Anxiety disorder Brother    Social History   Tobacco Use  . Smoking status: Never Smoker  . Smokeless tobacco: Never Used  Substance Use Topics  . Alcohol use: Yes    Alcohol/week: 20.0 standard drinks    Types: 20 Shots of liquor per week   Allergies:  Allergies  Allergen Reactions  . Sulfasalazine Hives  . Erythromycin Hives  . Hydroxychloroquine Hives  . Hydroxychloroquine Sulfate   . Septra [Sulfamethoxazole-Trimethoprim] Hives  . Sulfa Antibiotics Hives  . Tetracyclines & Related Hives  . Trimethoprim Other (See Comments)  . Simvastatin Rash   Scheduled Meds: . albuterol  2.5 mg Nebulization Q4H  . vitamin C  500 mg Oral Daily  . baricitinib  2 mg Oral Daily  . captopril  200 mg Oral BID  . chlorhexidine gluconate (MEDLINE KIT)  15 mL Mouth Rinse BID  . cholecalciferol  1,000 Units Oral Daily  . enoxaparin (LOVENOX) injection  40 mg Subcutaneous Q24H  . folic acid  1 mg Oral Daily  . gabapentin  200 mg Oral QHS  . LORazepam  0-4 mg Oral Q4H   Followed by  . [START ON 05/25/2020] LORazepam  0-4 mg Oral Q8H  . mouth rinse  15 mL Mouth Rinse 10 times per day  . methylPREDNISolone (SOLU-MEDROL) injection  1 mg/kg Intravenous Q12H   Followed by  . [START ON 2020/06/01] predniSONE  50 mg Oral Daily  . metoprolol tartrate  12.5 mg Oral BID  . multivitamin with minerals  1 tablet Oral Daily  . mycophenolate  2,000 mg Oral Daily  . NIFEdipine  120 mg Oral Daily  . Nintedanib  150 mg Oral Daily  . [START ON 05/24/2020] pantoprazole  (PROTONIX) IV  40 mg Intravenous Q24H  . sodium chloride flush  3 mL Intravenous Q12H  . thiamine  100 mg Oral Daily   Or  . thiamine  100 mg Intravenous Daily  . venlafaxine XR  75 mg Oral TID  . zinc sulfate  220 mg Oral Daily   Continuous Infusions: . sodium chloride    . fentaNYL infusion INTRAVENOUS    . propofol (DIPRIVAN) infusion 50 mcg/kg/min (05/08/2020 1850)   PRN Meds:.sodium chloride, acetaminophen, etomidate, fentaNYL, guaiFENesin-dextromethorphan, LORazepam **OR** LORazepam, ondansetron **OR** ondansetron (ZOFRAN) IV, rocuronium, sodium chloride flush    Results for orders placed or performed during the hospital encounter of 05/05/2020 (from the past 48 hour(s))  CBC with Differential     Status: Abnormal   Collection Time: 05/17/2020  5:00 AM  Result Value Ref Range   WBC 14.8 (H) 4.0 - 10.5 K/uL   RBC 3.71 (L) 3.87 - 5.11 MIL/uL   Hemoglobin 11.3 (L) 12.0 - 15.0 g/dL  HCT 34.1 (L) 36.0 - 46.0 %   MCV 91.9 80.0 - 100.0 fL   MCH 30.5 26.0 - 34.0 pg   MCHC 33.1 30.0 - 36.0 g/dL   RDW 12.9 11.5 - 15.5 %   Platelets 285 150 - 400 K/uL   nRBC 0.0 0.0 - 0.2 %   Neutrophils Relative % 88 %   Neutro Abs 13.2 (H) 1.7 - 7.7 K/uL   Lymphocytes Relative 4 %   Lymphs Abs 0.6 (L) 0.7 - 4.0 K/uL   Monocytes Relative 6 %   Monocytes Absolute 0.8 0.1 - 1.0 K/uL   Eosinophils Relative 1 %   Eosinophils Absolute 0.1 0.0 - 0.5 K/uL   Basophils Relative 0 %   Basophils Absolute 0.0 0.0 - 0.1 K/uL   Immature Granulocytes 1 %   Abs Immature Granulocytes 0.08 (H) 0.00 - 0.07 K/uL    Comment: Performed at [facility], [address] [place], [place] [address]  Comprehensive metabolic panel     Status: Abnormal   Collection Time: 06/01/2020  5:00 AM  Result Value Ref Range   Sodium 128 (L) 135 - 145 mmol/L   Potassium 3.0 (L) 3.5 - 5.1 mmol/L   Chloride 95 (L) 98 - 111 mmol/L   CO2 20 (L) 22 - 32 mmol/L   Glucose, Bld 118 (H) 70 - 99 mg/dL    Comment: Glucose reference  range applies only to samples taken after fasting for at least 8 hours.   BUN 8 6 - 20 mg/dL   Creatinine, Ser 0.64 0.44 - 1.00 mg/dL   Calcium 8.2 (L) 8.9 - 10.3 mg/dL   Total Protein 7.4 6.5 - 8.1 g/dL   Albumin 3.2 (L) 3.5 - 5.0 g/dL   AST 30 15 - 41 U/L   ALT 7 0 - 44 U/L   Alkaline Phosphatase 83 38 - 126 U/L   Total Bilirubin 0.8 0.3 - 1.2 mg/dL   GFR, Estimated >60 >60 mL/min    Comment: (NOTE) Calculated using the CKD-EPI Creatinine Equation (2021)    Anion gap 13 5 - 15    Comment: Performed at [facility], [place], [place] [address]  Troponin I (High Sensitivity)     Status: None   Collection Time: 05/28/2020  5:00 AM  Result Value Ref Range   Troponin I (High Sensitivity) 8 <18 ng/L    Comment: (NOTE) Elevated high sensitivity troponin I (hsTnI) values and significant  changes across serial measurements may suggest ACS but many other  chronic and acute conditions are known to elevate hsTnI results.  Refer to the "Links" section for chest pain algorithms and additional  guidance. Performed at [facility], [address] [place], [place] [address]   Procalcitonin - Baseline     Status: None   Collection Time: 05/24/2020  5:00 AM  Result Value Ref Range   Procalcitonin 0.19 ng/mL    Comment:        Interpretation: PCT (Procalcitonin) <= 0.5 ng/mL: Systemic infection (sepsis) is not likely. Local bacterial infection is possible. (NOTE)       Sepsis PCT Algorithm           Lower Respiratory Tract                                      Infection PCT Algorithm    ----------------------------     ----------------------------  PCT < 0.25 ng/mL                PCT < 0.10 ng/mL          Strongly encourage             Strongly discourage   discontinuation of antibiotics    initiation of antibiotics    ----------------------------     -----------------------------       PCT 0.25 - 0.50 ng/mL            PCT 0.10 - 0.25 ng/mL                OR       >80% decrease in PCT            Discourage initiation of                                            antibiotics      Encourage discontinuation           of antibiotics    ----------------------------     -----------------------------         PCT >= 0.50 ng/mL              PCT 0.26 - 0.50 ng/mL               AND        <80% decrease in PCT             Encourage initiation of                                             antibiotics       Encourage continuation           of antibiotics    ----------------------------     -----------------------------        PCT >= 0.50 ng/mL                  PCT > 0.50 ng/mL               AND         increase in PCT                  Strongly encourage                                      initiation of antibiotics    Strongly encourage escalation           of antibiotics                                     -----------------------------                                           PCT <= 0.25 ng/mL  OR                                        > 80% decrease in PCT                                      Discontinue / Do not initiate                                             antibiotics  Performed at [facility], [address] [place], [place] [address]   Blood gas, venous     Status: Abnormal (Preliminary result)   Collection Time: 05/16/2020  5:08 AM  Result Value Ref Range   FIO2 PENDING    pH, Ven 7.34 7.250 - 7.430   pCO2, Ven 34 (L) 44.0 - 60.0 mmHg   pO2, Ven 51.0 (H) 32.0 - 45.0 mmHg   Bicarbonate 18.3 (L) 20.0 - 28.0 mmol/L   Acid-base deficit 6.7 (H) 0.0 - 2.0 mmol/L   O2 Saturation 83.2 %   Patient temperature 37.0    Collection site VENOUS    Sample type VENOUS     Comment: Performed at [facility], [address] [place], [place] [address]   Mechanical Rate PENDING   Troponin I (High Sensitivity)     Status: None   Collection Time: 05/27/2020  7:33 AM   Result Value Ref Range   Troponin I (High Sensitivity) 6 <18 ng/L    Comment: (NOTE) Elevated high sensitivity troponin I (hsTnI) values and significant  changes across serial measurements may suggest ACS but many other  chronic and acute conditions are known to elevate hsTnI results.  Refer to the "Links" section for chest pain algorithms and additional  guidance. Performed at [facility], [place], [place] [address]   POC SARS Coronavirus 2 Ag-ED - Nasal Swab (BD Veritor Kit)     Status: None   Collection Time: 05/05/2020  8:58 AM  Result Value Ref Range   SARS Coronavirus 2 Ag NEGATIVE NEGATIVE    Comment: (NOTE) SARS-CoV-2 antigen NOT DETECTED.   Negative results are presumptive.  Negative results do not preclude SARS-CoV-2 infection and should not be used as the sole basis for treatment or other patient management decisions, including infection  control decisions, particularly in the presence of clinical signs and  symptoms consistent with COVID-19, or in those who have been in contact with the virus.  Negative results must be combined with clinical observations, patient history, and epidemiological information. The expected result is Negative.  Fact Sheet for Patients: HandmadeRecipes.com.cy  Fact Sheet for Healthcare Providers: FuneralLife.at  This test is not yet approved or cleared by the [place] FDA and  has been authorized for detection and/or diagnosis of SARS-CoV-2 by FDA under an Emergency Use Authorization (EUA).  This EUA will remain in effect (meaning this test can be used) for the duration of  the COV ID-19 declaration under Section 564(b)(1) of the Act, 21 U.S.C. section 360bbb-3(b)(1), unless the authorization is terminated or revoked sooner.    Blood gas, arterial     Status: Abnormal   Collection Time: 05/25/2020  4:26 PM  Result  Value Ref Range   FIO2 0.80    Delivery  systems BILEVEL POSITIVE AIRWAY PRESSURE    LHR 10 resp/min   Inspiratory PAP 12    Expiratory PAP 6.0    pH, Arterial 7.35 7.350 - 7.450   pCO2 arterial 39 32.0 - 48.0 mmHg   pO2, Arterial 70 (L) 83.0 - 108.0 mmHg   Bicarbonate 21.5 20.0 - 28.0 mmol/L   Acid-base deficit 3.8 (H) 0.0 - 2.0 mmol/L   O2 Saturation 92.9 %   Patient temperature 37.0    Collection site RIGHT RADIAL    Sample type ARTERIAL DRAW    Allens test (pass/fail) POSITIVE (A) PASS    Comment: Performed at [facility], [address], [place], [place] [address]    DG Chest Portable 1 View  Result Date: 05/16/2020 CLINICAL DATA:  shortness of breath. EXAM: PORTABLE CHEST 1 VIEW COMPARISON:  None FINDINGS: Decreased lung volumes. Bilateral interstitial and airspace opacities are identified with a peripheral predominance. Small pleural effusions are suspected bilaterally. IMPRESSION: Bilateral interstitial and airspace opacities compatible with multifocal infection. Electronically Signed   By: Kerby Moors M.D.   On: 05/09/2020 05:33    Review of Systems  Unable to perform ROS: Patient unresponsive    Blood pressure 102/76, pulse (!) 115, temperature (!) 97.1 F (36.2 C), temperature source Axillary, resp. rate 20, height 5' 3"  (1.6 m), weight 69.4 kg, SpO2 96 %. Physical Exam GENERAL: Chronically ill-appearing woman, appears much older than stated age.  Obtunded, on BiPAP not responding to deep sternal rub. HEAD: Normocephalic, atraumatic.  EYES: Pupils equal, round, reactive to light.  No scleral icterus.  MOUTH: Microstomia due to scleroderma, very dry oral mucosa. NECK: Supple. No thyromegaly. Trachea midline. No JVD.  No adenopathy. PULMONARY: Tachypneic, thoracoabdominal asynchrony noted even on BiPAP.  Coarse breath sounds throughout.  Crackles at bases.  No wheezes. CARDIOVASCULAR: S1 and S2.  Tachycardic rate regular rhythm.  Grade 2/6 systolic ejection murmur left sternal border. ABDOMEN:  Nondistended, normoactive bowel sounds, no tympany, no rebound.   MUSCULOSKELETAL: Mild to moderate sclerodactyly.  No clubbing, no edema.  NEUROLOGIC: Obtunded, not responsive even to sternal rub. SKIN: Intact,warm,dry.  Assessment/Plan:  Acute on chronic respiratory failure with hypoxia DDx: COVID-19 pneumonia/ARDS, bacterial pneumonia, ILD flare Hx: Scleroderma with associated ILD/pulmonary hypertension Intubate, mechanical ventilation VAP protocol Lung protective strategy Tracheal aspirate for C&S COVID-19 PCR for confirmation CT angio chest, rule out PE 2D echo to reevaluate PAH Supportive care Level of care: ICU  Hyponatremia Hypokalemia Likely due to volume depletion Volume resuscitate Lactated Ringer's with potassium chloride supplementation Monitor renal panel  Systemic sclerosis with ILD/pulmonary hypertension IV steroids Continue CellCept CellCept level Check 2D echo as above  Alcohol abuse and dependence Will be on sedation for ventilator comfort CIWA protocol Multivitamin supplementation Attention to electrolyte balance particularly magnesium Correct electrolytes as needed This issue adds complexity to her management  Prophylaxis PPI Enoxaparin   Protocols VAP ARDS net Continuous sedation ICU hyper/hypoglycemia protocol   Total critical care time excluding procedure time: 75 minutes.   Renold Don, MD [facility] 05/07/2020, 5:34 PM      *This note was dictated using voice recognition software/Dragon.  Despite best efforts to proofread, errors can occur which can change the meaning.  Any change was purely unintentional.

## 2020-05-23 NOTE — Progress Notes (Signed)
Called by nurse because patient remained tachycardic and tachypneic being on BiPAP episodes of desaturation. Consulted intensivist who recommended transfer to [facility] where patient gets her all her medical care. Called [facility] spoke to Dr Gaynelle Arabian who states that they do not have any beds available at this time and recommends to call back in 1 to 2 days to see if they have capacity to accept this patient.  They do not have a waiting list at this time Discussed with intensivist who states that the patient needs to be intubated at this time.

## 2020-05-23 NOTE — ED Provider Notes (Signed)
[facility] Emergency Department Provider Note  ____________________________________________  Time seen: Approximately 5:42 AM  I have reviewed the triage vital signs and the nursing notes.   HISTORY  Chief Complaint Shortness of Breath   HPI Janice Mayo is a 55 y.o. female with a history of systemic scleroderma, chronic lung disease, chronic kidney disease, covid + 9 days ago who presents for evaluation of SOB.  Patient reports progressively worsening shortness of breath over the last 2 to 3 days.  She is on chronic 2 L of oxygen at home.  Has finished 5 days of antiviral therapy.  This morning her shortness of breath became severe which prompted her to call EMS.  Upon EMS arrival patient was satting in the 50s on her baseline 3 L.  She was transported on CPAP.  Patient reports ongoing fevers and cough.  Denies any personal or family history of blood clots, recent travel immobilization, leg pain or swelling, hemoptysis or exogenous hormones.  She is fully vaccinated against Covid and flu.  She denies any chest pain, abdominal pain, vomiting or diarrhea.   Past Medical History:  Diagnosis Date  . Acute renal failure syndrome (HCC)   . Chronic kidney disease   . Chronic pulmonary heart disease (HCC)   . Diffuse spasm of esophagus   . Lung disease with systemic sclerosis (HCC)   . Obsessive-compulsive disorder   . Osteoporosis   . Raynaud's disease   . Systemic scleroses [facility])     Patient Active Problem List   Diagnosis Date Noted  . Obsessive-compulsive disorder with good or fair insight 12/13/2018  . Panic disorder 12/13/2018  . Alcohol use disorder, moderate, in early remission (HCC) 12/13/2018  . Age-related osteoporosis without current pathological fracture 02/28/2018  . Acute renal failure (HCC) 02/28/2018  . Dyskinesia of esophagus 02/28/2018  . Other chronic pulmonary heart diseases 02/28/2018  . Other injury of unspecified body region  02/28/2018  . Other long term (current) drug therapy 02/28/2018  . Pleurisy 02/28/2018  . Other specified hypothyroidism 02/28/2018  . Other specified depressive episodes 02/28/2018  . Other secondary pulmonary hypertension (HCC) 02/28/2018  . Other screening mammogram 02/28/2018  . Raynaud's syndrome 02/28/2018  . Postinflammatory pulmonary fibrosis (HCC) 02/28/2018  . Sclerodermia progressive (HCC) 02/21/2018  . Secondary hypertension 02/21/2018  . Pulmonary arterial hypertension (HCC) 11/24/2016  . Mixed obsessional thoughts and acts 08/24/2016  . Palpitations 08/24/2016  . Osteoporosis without current pathological fracture 11/12/2015  . Heavy alcohol consumption 11/03/2014  . Healthcare maintenance 10/31/2014  . Multiple nodules of lung 04/05/2014    No past surgical history on file.  Prior to Admission medications   Medication Sig Start Date End Date Taking? Authorizing Provider  captopril (CAPOTEN) 100 MG tablet Take by mouth. 01/07/09   [provider]  captopril (CAPOTEN) 100 MG tablet  08/16/18   [provider]  Cholecalciferol (VITAMIN D-1000 MAX ST) 25 MCG (1000 UT) tablet Take by mouth.    [provider]  denosumab (PROLIA) 60 MG/ML SOSY injection every 6 (six) months 02/06/17   [provider]  gabapentin (NEURONTIN) 100 MG capsule TAKE 2 CAPSULES BY MOUTH AT BEDTIME 09/23/19   Jomarie Longs, MD  metoprolol tartrate (LOPRESSOR) 25 MG tablet Take by mouth. 02/25/19 02/25/20  [provider]  Multiple Vitamins-Minerals (CENTRUM FLAVOR BURST ADULT) CHEW Chew by mouth.    [provider]  mycophenolate (CELLCEPT) 500 MG tablet Take by mouth.    [provider]  mycophenolate (CELLCEPT) 500 MG tablet 2,000 mg. bid 07/24/18   [provider]  NIFEdipine (PROCARDIA XL/NIFEDICAL XL) 60 MG 24 hr tablet Take by mouth. 09/24/08   [provider]  omeprazole (PRILOSEC) 20 MG capsule Take by mouth.  06/25/08   [provider]  traZODone (DESYREL) 50 MG tablet Take 0.5-1 tablets (25-50 mg total) by mouth at bedtime. For sleep 02/28/18   Jomarie LongsEappen, Saramma, MD  venlafaxine XR (EFFEXOR-XR) 150 MG 24 hr capsule Take 1 capsule (150 mg total) by mouth daily with breakfast. 09/18/19   Darcel SmallingUmrania, Hiren M, MD  venlafaxine XR (EFFEXOR-XR) 37.5 MG 24 hr capsule TAKE 1 CAPSULE BY MOUTH ONCE DAILY WITH BREAKFAST WITH 150MG  CAPS 09/18/19   Darcel SmallingUmrania, Hiren M, MD    Allergies Sulfasalazine, Erythromycin, Hydroxychloroquine, Hydroxychloroquine sulfate, Septra [sulfamethoxazole-trimethoprim], Sulfa antibiotics, Tetracyclines & related, Trimethoprim, and Simvastatin  Family History  Problem Relation Age of Onset  . Anxiety disorder Sister   . Anxiety disorder Brother     Social History Social History   Tobacco Use  . Smoking status: Never Smoker  . Smokeless tobacco: Never Used  Vaping Use  . Vaping Use: Never used  Substance Use Topics  . Alcohol use: Yes    Alcohol/week: 20.0 standard drinks    Types: 20 Shots of liquor per week  . Drug use: Not Currently    Review of Systems  Constitutional: Negative for fever. Eyes: Negative for visual changes. ENT: Negative for sore throat. Neck: No neck pain  Cardiovascular: Negative for chest pain. Respiratory: + shortness of breath. Gastrointestinal: Negative for abdominal pain, vomiting or diarrhea. Genitourinary: Negative for dysuria. Musculoskeletal: Negative for back pain. Skin: Negative for rash. Neurological: Negative for headaches, weakness or numbness. Psych: No SI or HI  ____________________________________________   PHYSICAL EXAM:  VITAL SIGNS: ED Triage Vitals  Enc Vitals Group     BP 08/22/20 0500 110/74     Pulse Rate 08/22/20 0500 (!) 108     Resp 08/22/20 0500 (!) 30     Temp 08/22/20 0500 (!) 97.1 F (36.2 C)     Temp Source 08/22/20 0500 Axillary     SpO2 08/22/20 0500 100 %     Weight 08/22/20 0507 153 lb (69.4  kg)     Height 08/22/20 0507 5\' 3"  (1.6 m)     Head Circumference --      Peak Flow --      Pain Score 08/22/20 0510 0     Pain Loc --      Pain Edu? --      Excl. in GC? --     Constitutional: Alert and oriented, moderate respiratory distress HEENT:      Head: Normocephalic and atraumatic.         Eyes: Conjunctivae are normal. Sclera is non-icteric.       Mouth/Throat: Mucous membranes are moist.       Neck: Supple with no signs of meningismus. Cardiovascular: Tachycardic with regular rhythm Respiratory: Severe respiratory distress, crackles bilaterally, tachypneic Gastrointestinal: Soft, non tender. Musculoskeletal:  No edema, cyanosis, or erythema of extremities. Neurologic: Normal speech and language. Face is symmetric. Moving all extremities. No gross focal neurologic deficits are appreciated. Skin: Skin is warm, dry and intact. No rash noted. Psychiatric: Mood and affect are normal. Speech and behavior are normal.  ____________________________________________   LABS (all labs ordered are listed, but only abnormal results are displayed)  Labs Reviewed  CBC WITH DIFFERENTIAL/PLATELET - Abnormal; Notable for the following components:  Result Value   WBC 14.8 (*)    RBC 3.71 (*)    Hemoglobin 11.3 (*)    HCT 34.1 (*)    Neutro Abs 13.2 (*)    Lymphs Abs 0.6 (*)    Abs Immature Granulocytes 0.08 (*)    All other components within normal limits  COMPREHENSIVE METABOLIC PANEL - Abnormal; Notable for the following components:   Sodium 128 (*)    Potassium 3.0 (*)    Chloride 95 (*)    CO2 20 (*)    Glucose, Bld 118 (*)    Calcium 8.2 (*)    Albumin 3.2 (*)    All other components within normal limits  BLOOD GAS, VENOUS - Abnormal; Notable for the following components:   pCO2, Ven 34 (*)    pO2, Ven 51.0 (*)    Bicarbonate 18.3 (*)    Acid-base deficit 6.7 (*)    All other components within normal limits  PROCALCITONIN  TROPONIN I (HIGH SENSITIVITY)    ____________________________________________  EKG  ED ECG REPORT I, Nita Sickle, the attending physician, personally viewed and interpreted this ECG.  Normal sinus rhythm, rate of 98, normal intervals, normal axis, no ST elevations or depressions, diffuse T wave flattening. ____________________________________________  RADIOLOGY  I have personally reviewed the images performed during this visit and I agree with the Radiologist's read.   Interpretation by Radiologist:  DG Chest Portable 1 View  Result Date: 05/10/2020 CLINICAL DATA:  shortness of breath. EXAM: PORTABLE CHEST 1 VIEW COMPARISON:  None FINDINGS: Decreased lung volumes. Bilateral interstitial and airspace opacities are identified with a peripheral predominance. Small pleural effusions are suspected bilaterally. IMPRESSION: Bilateral interstitial and airspace opacities compatible with multifocal infection. Electronically Signed   By: Signa Kell M.D.   On: 05/14/2020 05:33     ____________________________________________   PROCEDURES  Procedure(s) performed:yes .1-3 Lead EKG Interpretation Performed by: Nita Sickle, MD Authorized by: Nita Sickle, MD     Interpretation: non-specific     ECG rate assessment: normal     Rhythm: sinus rhythm     Ectopy: none     Conduction: normal     Critical Care performed: yes  CRITICAL CARE Performed by: Nita Sickle  ?  Total critical care time: 45 min  Critical care time was exclusive of separately billable procedures and treating other patients.  Critical care was necessary to treat or prevent imminent or life-threatening deterioration.  Critical care was time spent personally by me on the following activities: development of treatment plan with patient and/or surrogate as well as nursing, discussions with consultants, evaluation of patient's response to treatment, examination of patient, obtaining history from patient or surrogate,  ordering and performing treatments and interventions, ordering and review of laboratory studies, ordering and review of radiographic studies, pulse oximetry and re-evaluation of patient's condition.  ____________________________________________   INITIAL IMPRESSION / ASSESSMENT AND PLAN / ED COURSE  55 y.o. female with a history of systemic scleroderma, chronic lung disease, chronic kidney disease, covid + 9 days ago who presents for evaluation of SOB.  Patient found to be hypoxic satting in the 50s per EMS on her baseline 3 L nasal cannula.  Upon arrival patient is in moderate respiratory distress with crackles bilaterally.  She was transitioned to BiPAP and started on Solu-Medrol and duo nebs.  Chest x-ray visualized by me consistent with multifocal pneumonia, confirmed by radiology.  Respiratory status significantly improved on BiPAP.  VBG showing no signs of CO2 retention.  Patient  does have leukocytosis with white count of 14.2 with a left shift.  Possibly from Covid infection however procalcitonin is pending.  If that is elevated patient will be covered with antibiotics.  Metabolic panel showing mild hyponatremia with a sodium of 128 which is new for patient.  She does look dry on exam therefore fluids were initiated.  Mild hypokalemia for which she was supplemented p.o. Initial troponin negative making myocarditis less likely.        _____________________________________________ Please note:  Patient was evaluated in Emergency Department today for the symptoms described in the history of present illness. Patient was evaluated in the context of the global COVID-19 pandemic, which necessitated consideration that the patient might be at risk for infection with the SARS-CoV-2 virus that causes COVID-19. Institutional protocols and algorithms that pertain to the evaluation of patients at risk for COVID-19 are in a state of rapid change based on information released by regulatory bodies including the  CDC and federal and state organizations. These policies and algorithms were followed during the patient's care in the ED.  Some ED evaluations and interventions may be delayed as a result of limited staffing during the pandemic.   [place] Controlled Substance Database was reviewed by me. ____________________________________________   FINAL CLINICAL IMPRESSION(S) / ED DIAGNOSES   Final diagnoses:  Acute on chronic respiratory failure with hypoxia (HCC)  Pneumonia due to COVID-19 virus  Hyponatremia  Hypokalemia      NEW MEDICATIONS STARTED DURING THIS VISIT:  ED Discharge Orders    None       Note:  This document was prepared using Dragon voice recognition software and may include unintentional dictation errors.    Don Perking, [place], MD 05/22/2020 [phone]

## 2020-05-23 NOTE — ED Notes (Signed)
Pt awake and alert; oriented x4.  Pt now more relaxed with BiPaP  - anxious upon arrival with switch from ems CPAP to ED BiPAP; pt also noted to be turning blue between change.  Pt color now back to baseline.  Crackles noted to lower and mid posterior bases; posterior upper lobes CTA.  S1 and S2 regular.  Abdomen round, soft, nontender with hypoactive bowel sounds.  Scattered redness to face related to chronic scleroderma.  Skin otherwise dry and intact.  Will monitor for acute changes and maintain plan of care.

## 2020-05-23 NOTE — ED Notes (Signed)
Pt anxious and asking about home meds being ordered. Informed Dr Joylene Igo that pt asking for med reconciliation. Pt stating is "claustrophobic" and wanting to take break from bipap mask. Explained that Bipap must remain in place and that pt has needed it to maintain spo2 within nml range.

## 2020-05-23 NOTE — H&P (Addendum)
History and Physical    Janice Mayo YQM:[id number] DOB: 1966-02-18 DOA: 05-28-20  PCP: Jannifer Hick, MD   Patient coming from: Home  I have personally briefly reviewed patient's old medical records in [facility]  Chief Complaint: Shortness of breath  HPI: Janice Mayo is a 55 y.o. female with medical history significant for scleroderma, Raynaud's disease, OCD, chronic respiratory failure on 2 to 3 L of oxygen and recent COVID-19 positive test (home test ) on 05/15/20 who presents to the ER via EMS for evaluation of worsening shortness of breath.  Patient states that at baseline she is short of breath and wears 2 L of oxygen.  Since she tested positive for the COVID-19 virus she has had worsening shortness of breath and has had to increase her oxygen to 3 L continuous.  She has a cough productive of clear phlegm associated with fever, chills, myalgia, nausea, diarrhea and poor oral intake. Patient's pulse oximetry on 3 L of oxygen was 50% and patient was placed on CPAP with PEEP of 12 and her pulse oximetry improved to 94%. She was transitioned to BiPAP (12/6 100%) in the emergency room. Patient is fully vaccinated against the COVID-19 virus and also received her influenza vaccine.  Patient was placed on 5 days of oral antiviral against the COVID-19 virus, Paxlovid by her pulmonologist and has completed the course. She denies having any chest pain, no palpitations, no diaphoresis, no abdominal pain, no headache, no urinary frequency, no nocturia, no dysuria, no blurred vision, no focal deficits. Venous blood gas 7.34/34/51/18.3/83.2 Labs show sodium 128, potassium 3.0, chloride 95, bicarb 20, glucose 118, BUN 8, creatinine 0.64, calcium 8.2, AST 30, ALT 7, total protein 7.4, troponin VI, procalcitonin 0.1, white count 14.8, hemoglobin 11.3, hematocrit 34.1, MCV 91.9, RDW 12.9, platelet count 285 Chest x-ray reviewed by me shows bilateral interstitial and airspace opacities  compatible with multifocal infection. Twelve-lead EKG reviewed by me shows sinus tachycardia with ST depression in the lateral leads    ED Course: Patient is a 55 year old Caucasian female who presents to the ER via EMS for evaluation of worsening shortness of breath.  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  On 3 L of oxygen she had a pulse oximetry of 50% and was placed on CPAP by EMS with improvement in her pulse oximetry.  She was transitioned to BiPAP in the emergency room.  Patient had a positive home Covid test about 9 days prior to her admission and was treated as an outpatient with a 5-day course of Paxlovid.  Chest x-ray shows multifocal pneumonia.  She will be admitted to the hospital for further evaluation.    Review of Systems: As per HPI otherwise all other systems reviewed and negative.    Past Medical History:  Diagnosis Date  . Acute renal failure syndrome (HCC)   . Chronic kidney disease   . Chronic pulmonary heart disease (HCC)   . Diffuse spasm of esophagus   . Lung disease with systemic sclerosis (HCC)   . Obsessive-compulsive disorder   . Osteoporosis   . Raynaud's disease   . Systemic scleroses (HCC)     No past surgical history on file.   reports that she has never smoked. She has never used smokeless tobacco. She reports current alcohol use of about 20.0 standard drinks of alcohol per week. She reports previous drug use.  Allergies  Allergen Reactions  .  Sulfasalazine Hives  . Erythromycin Hives  . Hydroxychloroquine Hives  . Hydroxychloroquine Sulfate   . Septra [Sulfamethoxazole-Trimethoprim] Hives  . Sulfa Antibiotics Hives  . Tetracyclines & Related Hives  . Trimethoprim Other (See Comments)  . Simvastatin Rash    Family History  Problem Relation Age of Onset  . Anxiety disorder Sister   . Anxiety disorder Brother       Prior to Admission medications   Medication Sig  Start Date End Date Taking? Authorizing Provider  captopril (CAPOTEN) 100 MG tablet Take 200 mg by mouth 2 (two) times daily. 01/07/09  Yes [provider]  Cholecalciferol 25 MCG (1000 UT) tablet Take 1,000 Units by mouth daily.   Yes [provider]  denosumab (PROLIA) 60 MG/ML SOSY injection Inject 60 mg into the skin every 6 (six) months. 02/06/17  Yes [provider]  gabapentin (NEURONTIN) 100 MG capsule TAKE 2 CAPSULES BY MOUTH AT BEDTIME 09/23/19  Yes Eappen, Levin BaconSaramma, MD  metoprolol tartrate (LOPRESSOR) 25 MG tablet Take 12.5 mg by mouth 2 (two) times daily. 02/25/19 05/14/2020 Yes [provider]  mycophenolate (CELLCEPT) 500 MG tablet Take 2,000 mg by mouth daily.   Yes [provider]  NIFEdipine (PROCARDIA XL/NIFEDICAL XL) 60 MG 24 hr tablet Take 120 mg by mouth daily. 09/24/08  Yes [provider]  omeprazole (PRILOSEC) 20 MG capsule Take 40 mg by mouth daily. 06/25/08  Yes [provider]  venlafaxine XR (EFFEXOR-XR) 75 MG 24 hr capsule Take 225 mg by mouth daily. 04/15/20  Yes [provider]  Multiple Vitamins-Minerals (CENTRUM FLAVOR BURST ADULT) CHEW Chew by mouth.    [provider]  OFEV 150 MG CAPS Take by mouth. 05/21/20   [provider]  PAXLOVID 20 x 150 MG & 10 x 100MG  TBPK See admin instructions. 05/15/20   [provider]  traZODone (DESYREL) 50 MG tablet Take 0.5-1 tablets (25-50 mg total) by mouth at bedtime. For sleep 02/28/18   Jomarie LongsEappen, Saramma, MD  venlafaxine XR (EFFEXOR-XR) 150 MG 24 hr capsule Take 1 capsule (150 mg total) by mouth daily with breakfast. Patient not taking: Reported on 05/24/2020 09/18/19   Darcel SmallingUmrania, Hiren M, MD  venlafaxine XR (EFFEXOR-XR) 37.5 MG 24 hr capsule TAKE 1 CAPSULE BY MOUTH ONCE DAILY WITH BREAKFAST WITH 150MG  CAPS 09/18/19   Darcel SmallingUmrania, Hiren M, MD    Physical Exam: Vitals:   05/16/2020 0745 05/07/2020 0800 05/12/2020 0830 05/07/2020 0900  BP: 102/68 107/72  118/80 109/78  Pulse: (!) 105 (!) 103 (!) 103 (!) 101  Resp: (!) 26 (!) 40 (!) 23 (!) 38  Temp:      TempSrc:      SpO2: 100% 100% 100% 100%  Weight:      Height:         Vitals:   05/31/2020 0745 05/07/2020 0800 05/18/2020 0830 05/08/2020 0900  BP: 102/68 107/72 118/80 109/78  Pulse: (!) 105 (!) 103 (!) 103 (!) 101  Resp: (!) 26 (!) 40 (!) 23 (!) 38  Temp:      TempSrc:      SpO2: 100% 100% 100% 100%  Weight:      Height:          Constitutional: Alert and oriented x 3 . Not in any apparent distress.  Appears comfortable on BiPAP HEENT:      Head: Normocephalic and atraumatic.         Eyes: PERLA, EOMI, Conjunctivae are normal. Sclera is non-icteric.  Mouth/Throat: Mucous membranes are moist.       Neck: Supple with no signs of meningismus. Cardiovascular:  Tachycardic. No murmurs, gallops, or rubs. 2+ symmetrical distal pulses are present . No JVD. No LE edema Respiratory:  Tachypneic.bilateral air entry in all lung fields. No wheezes, crackles, or rhonchi.  Gastrointestinal: Soft, non tender, and non distended with positive bowel sounds.  Genitourinary: No CVA tenderness. Musculoskeletal: Nontender with normal range of motion in all extremities. No cyanosis, or erythema of extremities. Neurologic:  Face is symmetric. Moving all extremities. No gross focal neurologic deficits  Skin: Skin is warm, dry.  No rash or ulcers Psychiatric: Mood and affect are normal   Labs on Admission: I have personally reviewed following labs and imaging studies  CBC: Recent Labs  Lab 05/17/2020 0500  WBC 14.8*  NEUTROABS 13.2*  HGB 11.3*  HCT 34.1*  MCV 91.9  PLT 285   Basic Metabolic Panel: Recent Labs  Lab 05/18/2020 0500  NA 128*  K 3.0*  CL 95*  CO2 20*  GLUCOSE 118*  BUN 8  CREATININE 0.64  CALCIUM 8.2*   GFR: Estimated Creatinine Clearance: 76 mL/min (by C-G formula based on SCr of 0.64 mg/dL). Liver Function Tests: Recent Labs  Lab 05/22/2020 0500  AST 30  ALT 7   ALKPHOS 83  BILITOT 0.8  PROT 7.4  ALBUMIN 3.2*   No results for input(s): LIPASE, AMYLASE in the last 168 hours. No results for input(s): AMMONIA in the last 168 hours. Coagulation Profile: No results for input(s): INR, PROTIME in the last 168 hours. Cardiac Enzymes: No results for input(s): CKTOTAL, CKMB, CKMBINDEX, TROPONINI in the last 168 hours. BNP (last 3 results) No results for input(s): PROBNP in the last 8760 hours. HbA1C: No results for input(s): HGBA1C in the last 72 hours. CBG: No results for input(s): GLUCAP in the last 168 hours. Lipid Profile: No results for input(s): CHOL, HDL, LDLCALC, TRIG, CHOLHDL, LDLDIRECT in the last 72 hours. Thyroid Function Tests: No results for input(s): TSH, T4TOTAL, FREET4, T3FREE, THYROIDAB in the last 72 hours. Anemia Panel: No results for input(s): VITAMINB12, FOLATE, FERRITIN, TIBC, IRON, RETICCTPCT in the last 72 hours. Urine analysis: No results found for: COLORURINE, APPEARANCEUR, LABSPEC, PHURINE, GLUCOSEU, HGBUR, BILIRUBINUR, KETONESUR, PROTEINUR, UROBILINOGEN, NITRITE, LEUKOCYTESUR  Radiological Exams on Admission: DG Chest Portable 1 View  Result Date: 05/18/2020 CLINICAL DATA:  shortness of breath. EXAM: PORTABLE CHEST 1 VIEW COMPARISON:  None FINDINGS: Decreased lung volumes. Bilateral interstitial and airspace opacities are identified with a peripheral predominance. Small pleural effusions are suspected bilaterally. IMPRESSION: Bilateral interstitial and airspace opacities compatible with multifocal infection. Electronically Signed   By: Signa Kell M.D.   On: 05/31/2020 05:33     Assessment/Plan Principal Problem:   Acute on chronic respiratory failure with hypoxia (HCC) Active Problems:   Other specified hypothyroidism   Raynaud's syndrome   Lung disease with systemic sclerosis (HCC)   Hyponatremia   Hypokalemia   Pneumonia due to COVID-19 virus   Essential hypertension     Pneumonia due to COVID-19  virus with acute on chronic respiratory failure  Patient has a history of chronic respiratory failure and at baseline wears 2 to 3 L of oxygen via nasal cannula.  She presents for evaluation of worsening shortness of breath from her baseline and on 3 L of oxygen had room air pulse oximetry of 50% requiring CPAP and has now been transitioned to BiPAP 12/6/100% with pulse oximetry greater than 92% Patient is tachypneic  with increased work of breathing and requires noninvasive mechanical ventilation to improve oxygenation and reduce work of breathing Chest x-ray shows multifocal infiltrates Patient had a positive home COVID-19 test and PCR results are pending at this time Patient completed a course of Paxlovid as an outpatient We will place patient on IV Solu-Medrol and baricitinib Supportive care with antitussives as well as bronchodilator therapy We will trend inflammatory markers   Systemic sclerosis Continue captopril and CellCept    Depression and anxiety Continue venlafaxine and trazodone   Hypokalemia/hyponatremia Most likely related to poor oral intake and GI losses Supplement potassium   Hypertension Continue nifedipine and metoprolol    Alcohol abuse/dependence Patient at high risk of developing withdrawal symptoms Will place patient on Lorazepam IV and administer for CIWA score of 8 or greater Place patient on thiamine and Folic acid  DVT prophylaxis: Lovenox Code Status: full code Family Communication: Greater than 50% of time was spent discussing patient's condition and plan of care with him at the bedside.  All questions and concerns have been addressed.  She verbalizes understanding and agrees to the plan. Disposition Plan: Back to previous home environment Consults called: none Status: Inpatient.  The medical decision making for this patient was of high complexity and patient is at high risk for clinical deterioration during this hospitalization.    Montserrath Madding MD [facility]     05/14/2020, 10:00 AM

## 2020-05-23 NOTE — ED Notes (Signed)
Carlynn Purl [phone] sister of patient

## 2020-05-23 NOTE — ED Notes (Signed)
Pt on bedpan to void per pt request.

## 2020-05-23 NOTE — ED Notes (Signed)
Charge nurse reporting malfunction with original BiPaP mask -- reports patient O2 desat -- Pt with new mask now back @100 % - Pt also noted to be hypotensive 87/69 - current bp 103/78 with 1L bolus infusing via gravity to 18G L AC

## 2020-05-24 ENCOUNTER — Inpatient Hospital Stay: Payer: 59

## 2020-05-24 ENCOUNTER — Encounter: Payer: Self-pay | Admitting: Internal Medicine

## 2020-05-24 ENCOUNTER — Inpatient Hospital Stay: Admit: 2020-05-24 | Payer: 59

## 2020-05-24 DIAGNOSIS — J9621 Acute and chronic respiratory failure with hypoxia: Secondary | ICD-10-CM | POA: Diagnosis not present

## 2020-05-24 LAB — FERRITIN: Ferritin: 120 ng/mL (ref 11–307)

## 2020-05-24 LAB — POTASSIUM: Potassium: 4.8 mmol/L (ref 3.5–5.1)

## 2020-05-24 LAB — CBC WITH DIFFERENTIAL/PLATELET
Abs Immature Granulocytes: 0.16 10*3/uL — ABNORMAL HIGH (ref 0.00–0.07)
Basophils Absolute: 0 10*3/uL (ref 0.0–0.1)
Basophils Relative: 0 %
Eosinophils Absolute: 0 10*3/uL (ref 0.0–0.5)
Eosinophils Relative: 0 %
HCT: 31.1 % — ABNORMAL LOW (ref 36.0–46.0)
Hemoglobin: 9.7 g/dL — ABNORMAL LOW (ref 12.0–15.0)
Immature Granulocytes: 1 %
Lymphocytes Relative: 2 %
Lymphs Abs: 0.4 10*3/uL — ABNORMAL LOW (ref 0.7–4.0)
MCH: 30.2 pg (ref 26.0–34.0)
MCHC: 31.2 g/dL (ref 30.0–36.0)
MCV: 96.9 fL (ref 80.0–100.0)
Monocytes Absolute: 0.8 10*3/uL (ref 0.1–1.0)
Monocytes Relative: 3 %
Neutro Abs: 21.3 10*3/uL — ABNORMAL HIGH (ref 1.7–7.7)
Neutrophils Relative %: 94 %
Platelets: 243 10*3/uL (ref 150–400)
RBC: 3.21 MIL/uL — ABNORMAL LOW (ref 3.87–5.11)
RDW: 13.2 % (ref 11.5–15.5)
WBC: 22.6 10*3/uL — ABNORMAL HIGH (ref 4.0–10.5)
nRBC: 0 % (ref 0.0–0.2)

## 2020-05-24 LAB — COMPREHENSIVE METABOLIC PANEL
ALT: 6 U/L (ref 0–44)
AST: 28 U/L (ref 15–41)
Albumin: 2.8 g/dL — ABNORMAL LOW (ref 3.5–5.0)
Alkaline Phosphatase: 91 U/L (ref 38–126)
Anion gap: 6 (ref 5–15)
BUN: 14 mg/dL (ref 6–20)
CO2: 24 mmol/L (ref 22–32)
Calcium: 8 mg/dL — ABNORMAL LOW (ref 8.9–10.3)
Chloride: 100 mmol/L (ref 98–111)
Creatinine, Ser: 0.88 mg/dL (ref 0.44–1.00)
GFR, Estimated: >60 mL/min (ref >60)
Glucose, Bld: 164 mg/dL — ABNORMAL HIGH (ref 70–99)
Potassium: 5.5 mmol/L — ABNORMAL HIGH (ref 3.5–5.1)
Sodium: 130 mmol/L — ABNORMAL LOW (ref 135–145)
Total Bilirubin: 0.6 mg/dL (ref 0.3–1.2)
Total Protein: 6.8 g/dL (ref 6.5–8.1)

## 2020-05-24 LAB — MAGNESIUM: Magnesium: 2 mg/dL (ref 1.7–2.4)

## 2020-05-24 LAB — C-REACTIVE PROTEIN: CRP: 17.3 mg/dL — ABNORMAL HIGH (ref <1.0)

## 2020-05-24 LAB — PHOSPHORUS: Phosphorus: 4.6 mg/dL (ref 2.5–4.6)

## 2020-05-24 LAB — HIV ANTIBODY (ROUTINE TESTING W REFLEX): HIV Screen 4th Generation wRfx: NONREACTIVE

## 2020-05-24 MED ORDER — LORAZEPAM 2 MG PO TABS: 0.0000 mg | ORAL_TABLET | ORAL | Status: DC

## 2020-05-24 MED ORDER — LORAZEPAM 2 MG PO TABS: 0.0000 mg | ORAL_TABLET | Freq: Three times a day (TID) | ORAL | Status: DC

## 2020-05-24 MED ORDER — ASCORBIC ACID 500 MG PO TABS
500.0000 mg | ORAL_TABLET | Freq: Every day | ORAL | Status: DC
  Administered 2020-05-24 – 2020-05-25 (×2): 500 mg
  Filled 2020-05-24 (×2): qty 1

## 2020-05-24 MED ORDER — LACTATED RINGERS IV SOLN: INTRAVENOUS | Status: DC

## 2020-05-24 MED ORDER — ZINC SULFATE 220 (50 ZN) MG PO CAPS
220.0000 mg | ORAL_CAPSULE | Freq: Every day | ORAL | Status: DC
  Administered 2020-05-24 – 2020-05-25 (×2): 220 mg
  Filled 2020-05-24 (×2): qty 1

## 2020-05-24 MED ORDER — VITAMIN D 25 MCG (1000 UNIT) PO TABS
1000.0000 [IU] | ORAL_TABLET | Freq: Every day | ORAL | Status: DC
  Administered 2020-05-24 – 2020-05-25 (×2): 1000 [IU]
  Filled 2020-05-24 (×2): qty 1

## 2020-05-24 MED ORDER — LORAZEPAM 1 MG PO TABS
1.0000 mg | ORAL_TABLET | ORAL | Status: DC | PRN
  Administered 2020-05-25: 4 mg

## 2020-05-24 MED ORDER — LORAZEPAM 2 MG PO TABS
0.0000 mg | ORAL_TABLET | ORAL | Status: DC
  Administered 2020-05-25: 2 mg
  Filled 2020-05-24: qty 2
  Filled 2020-05-24: qty 1

## 2020-05-24 MED ORDER — NOREPINEPHRINE 4 MG/250ML-% IV SOLN
INTRAVENOUS | Status: AC
  Administered 2020-05-24: 2 ug/min via INTRAVENOUS
  Filled 2020-05-24: qty 250

## 2020-05-24 MED ORDER — FUROSEMIDE 10 MG/ML IJ SOLN
40.0000 mg | Freq: Once | INTRAMUSCULAR | Status: AC
  Administered 2020-05-24: 40 mg via INTRAVENOUS
  Filled 2020-05-24: qty 4

## 2020-05-24 MED ORDER — ADULT MULTIVITAMIN W/MINERALS CH
1.0000 | ORAL_TABLET | Freq: Every day | ORAL | Status: DC
  Administered 2020-05-24 – 2020-05-25 (×2): 1
  Filled 2020-05-24 (×2): qty 1

## 2020-05-24 MED ORDER — METHYLPREDNISOLONE SODIUM SUCC 125 MG IJ SOLR
60.0000 mg | Freq: Two times a day (BID) | INTRAMUSCULAR | Status: DC
  Administered 2020-05-24 – 2020-05-26 (×5): 60 mg via INTRAVENOUS
  Filled 2020-05-24 (×5): qty 2

## 2020-05-24 MED ORDER — SODIUM CHLORIDE 0.9 % IV SOLN
250.0000 mL | INTRAVENOUS | Status: DC
  Administered 2020-05-25: 250 mL via INTRAVENOUS

## 2020-05-24 MED ORDER — VENLAFAXINE HCL 37.5 MG PO TABS
75.0000 mg | ORAL_TABLET | Freq: Three times a day (TID) | ORAL | Status: DC
  Administered 2020-05-24 – 2020-05-25 (×5): 75 mg
  Filled 2020-05-24 (×8): qty 2

## 2020-05-24 MED ORDER — NOREPINEPHRINE 4 MG/250ML-% IV SOLN
2.0000 ug/min | INTRAVENOUS | Status: DC
  Administered 2020-05-24: 7 ug/min via INTRAVENOUS
  Administered 2020-05-25: 14 ug/min via INTRAVENOUS
  Administered 2020-05-25: 10 ug/min via INTRAVENOUS
  Administered 2020-05-25: 14 ug/min via INTRAVENOUS
  Administered 2020-05-25: 8 ug/min via INTRAVENOUS
  Filled 2020-05-24 (×5): qty 250

## 2020-05-24 MED ORDER — FOLIC ACID 1 MG PO TABS
1.0000 mg | ORAL_TABLET | Freq: Every day | ORAL | Status: DC
  Administered 2020-05-24 – 2020-05-25 (×2): 1 mg
  Filled 2020-05-24 (×2): qty 1

## 2020-05-24 MED ORDER — LORAZEPAM 2 MG/ML IJ SOLN
1.0000 mg | INTRAMUSCULAR | Status: DC | PRN
  Administered 2020-05-25: 2 mg via INTRAVENOUS
  Filled 2020-05-24: qty 1

## 2020-05-24 MED ORDER — GABAPENTIN 250 MG/5ML PO SOLN
200.0000 mg | Freq: Every day | ORAL | Status: DC
  Administered 2020-05-25 (×2): 200 mg
  Filled 2020-05-24 (×5): qty 4

## 2020-05-24 NOTE — Progress Notes (Signed)
Follow up - Critical Care Medicine Note  Patient Details:    Janice Mayo is an 55 y.o. female.exceedingly complex ,with a history of scleroderma with associated pulmonary disease and pulmonary hypertension [facility], she presented to [facility] after noting increasing shortness of breath worsening over the last 2 to 3 days prior to her presentation.  She  is on oxygen at 2 L/min chronically at home.  Had tested positive for COVID-19 via home test 9 days prior treated with Paxil COVID.  Presented in full-blown respiratory failure requiring intubation.  PCCM took over case.  Lines, Airways, Drains: Airway 7.5 mm (Active)  Secured at (cm) 22 cm 05/24/20 1520  Measured From Lips 05/24/20 1520  Secured Location Left 05/24/20 1507  Secured By [employer] Tube Holder 05/24/20 1520  Tube Holder Repositioned Yes 05/24/20 1507  Prone position No Jul 18, 2020 2000  Cuff Pressure (cm H2O) 28 cm H2O 05/24/20 1507  Site Condition Dry 05/24/20 1520     NG/OG Tube Orogastric 16 Fr. Center mouth Xray Documented cm marking at nare/ corner of mouth 65 cm (Active)  Cm Marking at Nare/Corner of Mouth (if applicable) 80 cm 05/24/20 1520  Site Assessment Clean;Dry;Intact 05/24/20 1520  Ongoing Placement Verification No change in cm markings or external length of tube from initial placement;No change in respiratory status;No acute changes, not attributed to clinical condition 05/24/20 1520  Status Irrigated;Clamped 05/24/20 1520  Intake (mL) 30 mL 05/24/20 1700  Output (mL) 100 mL 05/24/20 0900     Urethral Catheter zach rn  (Active)  Indication for Insertion or Continuance of Catheter Unstable critically ill patients first 24-48 hours (See Criteria) 05/24/20 0722  Site Assessment Clean;Dry;Intact 05/24/20 0722  Catheter Maintenance Bag below level of bladder;Catheter secured;Drainage bag/tubing not touching floor;No dependent loops;Seal intact 05/24/20 0722  Collection Container Standard drainage bag  05/24/20 0722  Securement Method Securing device (Describe) 05/24/20 0722  Output (mL) 20 mL 05/24/20 1700    Anti-infectives:  Anti-infectives (From admission, onward)   None      Microbiology: Results for orders placed or performed during the hospital encounter of Jul 18, 2020  SARS CORONAVIRUS 2 (TAT 6-24 HRS) Nasopharyngeal Nasopharyngeal Swab     Status: Abnormal   Collection Time: Jul 18, 2020  9:20 AM   Specimen: Nasopharyngeal Swab  Result Value Ref Range Status   SARS Coronavirus 2 POSITIVE (A) NEGATIVE Final    Comment: (NOTE) SARS-CoV-2 target nucleic acids are DETECTED.  The SARS-CoV-2 RNA is generally detectable in upper and lower respiratory specimens during the acute phase of infection. Positive results are indicative of the presence of SARS-CoV-2 RNA. Clinical correlation with patient history and other diagnostic information is  necessary to determine patient infection status. Positive results do not rule out bacterial infection or co-infection with other viruses.  The expected result is Negative.  Fact Sheet for Patients: HairSlick.nohttps://www.fda.gov/media/138098/download  Fact Sheet for Healthcare Providers: quierodirigir.comhttps://www.fda.gov/media/138095/download  This test is not yet approved or cleared by the [place] FDA and  has been authorized for detection and/or diagnosis of SARS-CoV-2 by FDA under an Emergency Use Authorization (EUA). This EUA will remain  in effect (meaning this test can be used) for the duration of the COVID-19 declaration under Section 564(b)(1) of the Act, 21 U. S.C. section 360bbb-3(b)(1), unless the authorization is terminated or revoked sooner.   Performed at [facility], [address] [place], [place] [address]   MRSA PCR Screening     Status: None   Collection Time: Jul 18, 2020  6:48 PM   Specimen: Nasopharyngeal  Result Value Ref Range Status   MRSA by PCR NEGATIVE NEGATIVE Final    Comment:        The GeneXpert MRSA Assay  (FDA approved for NASAL specimens only), is one component of a comprehensive MRSA colonization surveillance program. It is not intended to diagnose MRSA infection nor to guide or monitor treatment for MRSA infections. Performed at [facility], [address] [place], [place] [address]   Culture, Respiratory w Gram Stain     Status: None (Preliminary result)   Collection Time: 06-02-2020 11:31 PM   Specimen: Tracheal Aspirate; Respiratory  Result Value Ref Range Status   Specimen Description   Final    TRACHEAL ASPIRATE Performed at [facility], [address], [place], [place] [address]    Special Requests   Final    Immunocompromised Performed at [facility] [address] [place], [place] [address]    Gram Stain   Final    FEW WBC PRESENT, PREDOMINANTLY PMN FEW GRAM POSITIVE COCCI Performed at [facility], [address] [place], [place] [address]    Culture PENDING  Incomplete   Report Status PENDING  Incomplete    Best Practice/Protocols:  VTE Prophylaxis: Lovenox (prophylaxtic dose) GI Prophylaxis: Proton Pump Inhibitor Continous Sedation ARDS Hyperglycemia (ICU)  Events: 06-02-2020: Admitted due to acute respiratory failure with hypoxia from COVID-19 pneumonia.  Required intubation 05/24/2020: Remains mechanically ventilated FiO2 requirements decreasing  Studies: CT ANGIO CHEST PE W OR WO CONTRAST  Result Date: June 02, 2020 CLINICAL DATA:  Fevers and cough EXAM: CT ANGIOGRAPHY CHEST WITH CONTRAST TECHNIQUE: Multidetector CT imaging of the chest was performed using the standard protocol during bolus administration of intravenous contrast. Multiplanar CT image reconstructions and MIPs were obtained to evaluate the vascular anatomy. CONTRAST:  49mL OMNIPAQUE IOHEXOL 350 MG/ML SOLN COMPARISON:  Radiographs 02-Jun-2020 FINDINGS: Cardiovascular: Satisfactory opacification of pulmonary arteries though images are markedly motion  degraded which may limit detection of smaller segmental and subsegmental pulmonary artery emboli. No large central or lobar filling defects are identified. Few foci of gas within the pulmonary trunk likely related to intravenous access. Central pulmonary arteries are enlarged with an elevated PA to aorta ratio. Cardiomegaly. Trace pericardial effusion. The aortic root is suboptimally assessed given cardiac pulsation artifact. Atherosclerotic plaque within the normal caliber aorta. No acute luminal abnormality of the imaged aorta. No periaortic stranding or hemorrhage. Normal 3 vessel branching of the aortic arch. Proximal great vessels are unremarkable. Mediastinum/Nodes: No mediastinal fluid or gas. Normal thyroid gland and thoracic inlet. Endotracheal tube tip terminates in the mid trachea, 3 cm from the carina. Transesophageal tube in place coiling in the stomach with the tip near the gastric fundus and side port beyond the GE junction. Scattered low-attenuation, borderline enlarged mediastinal and hilar nodes are present for instance a 14 mm right hilar node (4/30), an 11 mm AP window lymph node (4/23), and a 14 mm subcarinal node (4/33). No worrisome axillary adenopathy. Lungs/Pleura: Extensive motion degradation limits evaluation of the lung parenchyma. Diffuse areas of mixed ground-glass opacity and basilar predominant consolidation throughout both lungs as well as interlobular septal thickening and fissural thickening. No pneumothorax. No sizable pleural effusion. Evaluation for underlying pulmonary nodules and masses is limited given the extent of parenchymal lung disease. Upper Abdomen: No acute abnormalities present in the visualized portions of the upper abdomen. Musculoskeletal: Multilevel degenerative changes are present in the imaged portions of the spine. Schmorl's node  formation L1. No worrisome chest wall masses or lesions. Review of the MIP images confirms the above findings. IMPRESSION: 1. No  large central or lobar filling defects are identified. More distal evaluation limited by extensive respiratory motion. 2. Central pulmonary arterial enlargement, nonspecific though can be seen with pulmonary artery hypertension. 3. Diffuse areas of mixed ground-glass opacity and basilar predominant consolidation throughout both lungs as well as interlobular septal thickening and fissural thickening. Findings are favored to represent a combination of pulmonary edema and atypical infection. 4. Cardiomegaly with trace pericardial effusion. 5. Scattered borderline enlarged mediastinal and hilar nodes, nonspecific, but favored to be reactive and or edematous. 6. Satisfactory positioning of the transesophageal and endotracheal tubes. 7. Aortic Atherosclerosis (ICD10-I70.0). Electronically Signed   By: Kreg Shropshire M.D.   On: 05-28-20 22:15   DG Chest Port 1 View  Result Date: 05/24/2020 CLINICAL DATA:  Acute respiratory failure EXAM: PORTABLE CHEST 1 VIEW COMPARISON:  Yesterday FINDINGS: The endotracheal tube tip is at the clavicular heads. The enteric tube is at the stomach. Confluent bilateral airspace disease with low lung volumes. Borderline heart size. No visible effusion or pneumothorax. IMPRESSION: Stable hardware positioning and confluent airspace disease. Electronically Signed   By: Marnee Spring M.D.   On: 05/24/2020 06:30   DG Chest Portable 1 View  Result Date: May 28, 2020 CLINICAL DATA:  Status post intubation and OG tube placement. EXAM: PORTABLE CHEST 1 VIEW COMPARISON:  Single-view of the chest earlier today. FINDINGS: Endotracheal tube is in good position with the tip at the clavicular heads. OG tube is also in good position with its tip in the fundus of the stomach. Extensive bilateral airspace disease persists without change. Cardiomegaly. IMPRESSION: ETT and OG tube in good position. No change in extensive bilateral airspace disease most compatible with pneumonia. Electronically Signed   By:  Drusilla Kanner M.D.   On: 2020/05/28 18:10   DG Chest Portable 1 View  Result Date: May 28, 2020 CLINICAL DATA:  shortness of breath. EXAM: PORTABLE CHEST 1 VIEW COMPARISON:  None FINDINGS: Decreased lung volumes. Bilateral interstitial and airspace opacities are identified with a peripheral predominance. Small pleural effusions are suspected bilaterally. IMPRESSION: Bilateral interstitial and airspace opacities compatible with multifocal infection. Electronically Signed   By: Signa Kell M.D.   On: 05-28-20 05:33    Consults:    Subjective:    Overnight Issues: No overnight issues.  FiO2 requirements down to 50% very poor lung compliance.  Objective:  Vital signs for last 24 hours: Temp:  [94.6 F (34.8 C)-98.78 F (37.1 C)] 98.78 F (37.1 C) (02/20 1900) Pulse Rate:  [60-115] 113 (02/20 1900) Resp:  [12-33] 28 (02/20 1900) BP: (68-122)/(50-83) 99/62 (02/20 1900) SpO2:  [1 %-99 %] 94 % (02/20 1900) FiO2 (%):  [50 %-60 %] 50 % (02/20 1507)  Hemodynamic parameters for last 24 hours:    Intake/Output from previous day: 02/19 0701 - 02/20 0700 In: 2515.7 [I.V.:1365.7; IV Piggyback:1150] Out: 625 [Urine:625]  Intake/Output this shift: No intake/output data recorded.  Vent settings for last 24 hours: Vent Mode: PRVC FiO2 (%):  [50 %-60 %] 50 % Set Rate:  [30 bmp] 30 bmp Vt Set:  [380 mL] 380 mL PEEP:  [8 cmH20-10 cmH20] 8 cmH20 Plateau Pressure:  [30 cmH20-38 cmH20] 30 cmH20  Physical Exam: Physical examination is limited due to need for PPE/CAPR GENERAL: Chronically ill-appearing woman, appears much older than stated age.  Sedated, mechanically ventilated. HEAD: Normocephalic, atraumatic.  EYES: Pupils equal, round, reactive to light.  No scleral icterus.  MOUTH: Microstomia due to scleroderma, orotracheally intubated, OG in place. NECK: Supple. No thyromegaly. Trachea midline. No JVD.  No adenopathy. PULMONARY:  Synchronous with the ventilator.  Coarse breath  sounds throughout.  Crackles at bases.  No wheezes. CARDIOVASCULAR: S1 and S2.    Monitor: Tachycardia remainder exam limited due to PPE ABDOMEN: Nondistended, normoactive bowel sounds, no tympany, no rebound.   MUSCULOSKELETAL: Mild to moderate sclerodactyly.  No clubbing, no edema.  NEUROLOGIC: Sedated, RASS -3, no further assessment can be made SKIN: Intact,warm,dry.  Assessment/Plan:   Acute on chronic respiratory failure with hypoxia DDx: COVID-19 pneumonia/ARDS, bacterial pneumonia, ILD flare Hx: Scleroderma with associated ILD/pulmonary hypertension Continue mechanical ventilation ARDS/lung protective strategy VAP protocol Tracheal aspirate for C&S, pending results COVID-19 PCR positive/confirmed CT angio chest, no large central PE  2D echo to reevaluate PAH, pending Continued supportive care Level of care: ICU  Hyponatremia Hyperkalemia Likely due to volume depletion Continue volume repletion Switch fluids to normal saline Monitor renal panel  Systemic sclerosis with ILD/pulmonary hypertension IV steroids Hold CellCept CellCept level Check 2D echo as above  Alcohol abuse and dependence Will be on sedation for ventilator comfort CIWA protocol Multivitamin supplementation Attention to electrolyte balance particularly magnesium Correct electrolytes as needed This issue adds complexity to her management  Prophylaxis PPI Enoxaparin   Protocols VAP ARDS net Continuous sedation ICU hyper/hypoglycemia protocol      LOS: 1 day   Additional comments: Care coordination with bedside nurse was performed  Critical Care Total Time*: 45 Minutes  C. Danice Goltz, MD [place] PCCM 05/24/2020  *Care during the described time interval was provided by me and/or other providers on the critical care team.  I have reviewed this patient's available data, including medical history, events of note, physical examination and test results as part of my  evaluation.  **This note was dictated using voice recognition software/Dragon.  Despite best efforts to proofread, errors can occur which can change the meaning.  Any change was purely unintentional.

## 2020-05-24 NOTE — Progress Notes (Signed)
Patient belongings, including her clothing, cellphone, home medications, and purse/bookbag given to [facility], patient's sister. No additional belonging remain at the bedside at this time.

## 2020-05-25 ENCOUNTER — Inpatient Hospital Stay: Payer: 59

## 2020-05-25 ENCOUNTER — Encounter: Payer: Self-pay | Admitting: Internal Medicine

## 2020-05-25 ENCOUNTER — Inpatient Hospital Stay: Payer: Self-pay

## 2020-05-25 ENCOUNTER — Inpatient Hospital Stay
Admit: 2020-05-25 | Discharge: 2020-05-25 | Disposition: A | Discharge status: Home / Self Care | Payer: 59 | Attending: Pulmonary Disease | Admitting: Pulmonary Disease

## 2020-05-25 DIAGNOSIS — E861 Hypovolemia: Secondary | ICD-10-CM

## 2020-05-25 DIAGNOSIS — J9621 Acute and chronic respiratory failure with hypoxia: Secondary | ICD-10-CM | POA: Diagnosis not present

## 2020-05-25 DIAGNOSIS — I7300 Raynaud's syndrome without gangrene: Secondary | ICD-10-CM

## 2020-05-25 DIAGNOSIS — M3481 Systemic sclerosis with lung involvement: Secondary | ICD-10-CM

## 2020-05-25 DIAGNOSIS — I270 Primary pulmonary hypertension: Secondary | ICD-10-CM

## 2020-05-25 DIAGNOSIS — U071 COVID-19: Secondary | ICD-10-CM | POA: Diagnosis not present

## 2020-05-25 DIAGNOSIS — J1282 Pneumonia due to coronavirus disease 2019: Secondary | ICD-10-CM

## 2020-05-25 DIAGNOSIS — N179 Acute kidney failure, unspecified: Secondary | ICD-10-CM

## 2020-05-25 DIAGNOSIS — I9589 Other hypotension: Secondary | ICD-10-CM

## 2020-05-25 LAB — COMPREHENSIVE METABOLIC PANEL
ALT: 8 U/L (ref 0–44)
AST: 27 U/L (ref 15–41)
Albumin: 2.6 g/dL — ABNORMAL LOW (ref 3.5–5.0)
Alkaline Phosphatase: 89 U/L (ref 38–126)
Anion gap: 8 (ref 5–15)
BUN: 23 mg/dL — ABNORMAL HIGH (ref 6–20)
CO2: 21 mmol/L — ABNORMAL LOW (ref 22–32)
Calcium: 7.6 mg/dL — ABNORMAL LOW (ref 8.9–10.3)
Chloride: 101 mmol/L (ref 98–111)
Creatinine, Ser: 2.4 mg/dL — ABNORMAL HIGH (ref 0.44–1.00)
GFR, Estimated: 24 mL/min — ABNORMAL LOW (ref >60)
Glucose, Bld: 155 mg/dL — ABNORMAL HIGH (ref 70–99)
Potassium: 4.9 mmol/L (ref 3.5–5.1)
Sodium: 130 mmol/L — ABNORMAL LOW (ref 135–145)
Total Bilirubin: 0.6 mg/dL (ref 0.3–1.2)
Total Protein: 6.3 g/dL — ABNORMAL LOW (ref 6.5–8.1)

## 2020-05-25 LAB — CBC WITH DIFFERENTIAL/PLATELET
Abs Immature Granulocytes: 0.34 10*3/uL — ABNORMAL HIGH (ref 0.00–0.07)
Basophils Absolute: 0.1 10*3/uL (ref 0.0–0.1)
Basophils Relative: 0 %
Eosinophils Absolute: 0 10*3/uL (ref 0.0–0.5)
Eosinophils Relative: 0 %
HCT: 28.9 % — ABNORMAL LOW (ref 36.0–46.0)
Hemoglobin: 8.6 g/dL — ABNORMAL LOW (ref 12.0–15.0)
Immature Granulocytes: 2 %
Lymphocytes Relative: 1 %
Lymphs Abs: 0.2 10*3/uL — ABNORMAL LOW (ref 0.7–4.0)
MCH: 30.7 pg (ref 26.0–34.0)
MCHC: 29.8 g/dL — ABNORMAL LOW (ref 30.0–36.0)
MCV: 103.2 fL — ABNORMAL HIGH (ref 80.0–100.0)
Monocytes Absolute: 0.2 10*3/uL (ref 0.1–1.0)
Monocytes Relative: 1 %
Neutro Abs: 17.2 10*3/uL — ABNORMAL HIGH (ref 1.7–7.7)
Neutrophils Relative %: 96 %
Platelets: 237 10*3/uL (ref 150–400)
RBC: 2.8 MIL/uL — ABNORMAL LOW (ref 3.87–5.11)
RDW: 12.8 % (ref 11.5–15.5)
WBC: 18.1 10*3/uL — ABNORMAL HIGH (ref 4.0–10.5)
nRBC: 0 % (ref 0.0–0.2)

## 2020-05-25 LAB — GLUCOSE, CAPILLARY
Glucose-Capillary: 117 mg/dL — ABNORMAL HIGH (ref 70–99)
Glucose-Capillary: 98 mg/dL (ref 70–99)
Glucose-Capillary: 95 mg/dL (ref 70–99)

## 2020-05-25 LAB — ECHOCARDIOGRAM COMPLETE
AR max vel: 2.56 cm2
AV Area VTI: 2.73 cm2
AV Area mean vel: 2.4 cm2
AV Mean grad: 4 mmHg
AV Peak grad: 6.3 mmHg
Ao pk vel: 1.25 m/s
Area-P 1/2: 5.42 cm2
Height: 63 in
S' Lateral: 2.17 cm
Weight: 2645.52 oz

## 2020-05-25 LAB — BLOOD GAS, ARTERIAL
Acid-base deficit: 12.2 mmol/L — ABNORMAL HIGH (ref 0.0–2.0)
Acid-base deficit: 5.8 mmol/L — ABNORMAL HIGH (ref 0.0–2.0)
Bicarbonate: 22.8 mmol/L (ref 20.0–28.0)
Bicarbonate: 22 mmol/L (ref 20.0–28.0)
FIO2: 0.9
FIO2: 0.9
MECHVT: 380 mL
MECHVT: 380 mL
Mechanical Rate: 30
Mechanical Rate: 34
O2 Saturation: 73.8 %
O2 Saturation: 83.6 %
PEEP: 8 cmH2O
PEEP: 7 cmH2O
Patient temperature: 37
Patient temperature: 37
RATE: 30 resp/min
RATE: 34 resp/min
pCO2 arterial: 119 mmHg (ref 32.0–48.0)
pCO2 arterial: 55 mmHg — ABNORMAL HIGH (ref 32.0–48.0)
pH, Arterial: 6.89 — CL (ref 7.350–7.450)
pH, Arterial: 7.21 — ABNORMAL LOW (ref 7.350–7.450)
pO2, Arterial: 67 mmHg — ABNORMAL LOW (ref 83.0–108.0)
pO2, Arterial: 59 mmHg — ABNORMAL LOW (ref 83.0–108.0)

## 2020-05-25 LAB — MAGNESIUM: Magnesium: 1.8 mg/dL (ref 1.7–2.4)

## 2020-05-25 LAB — C-REACTIVE PROTEIN: CRP: 13.9 mg/dL — ABNORMAL HIGH (ref <1.0)

## 2020-05-25 LAB — PHOSPHORUS: Phosphorus: 5.9 mg/dL — ABNORMAL HIGH (ref 2.5–4.6)

## 2020-05-25 LAB — FERRITIN: Ferritin: 177 ng/mL (ref 11–307)

## 2020-05-25 MED ORDER — VASOPRESSIN 20 UNITS/100 ML INFUSION FOR SHOCK
0.0000 [IU]/min | INTRAVENOUS | Status: DC
  Administered 2020-05-25 – 2020-05-26 (×3): 0.03 [IU]/min via INTRAVENOUS
  Filled 2020-05-25 (×4): qty 100

## 2020-05-25 MED ORDER — MIDAZOLAM HCL 2 MG/2ML IJ SOLN: 2.0000 mg | Freq: Once | INTRAMUSCULAR | Status: AC

## 2020-05-25 MED ORDER — SODIUM BICARBONATE 8.4 % IV SOLN
50.0000 meq | Freq: Once | INTRAVENOUS | Status: AC
  Administered 2020-05-25: 50 meq via INTRAVENOUS

## 2020-05-25 MED ORDER — VECURONIUM BROMIDE 10 MG IV SOLR
INTRAVENOUS | Status: AC
  Administered 2020-05-25: 10 mg via INTRAVENOUS
  Filled 2020-05-25: qty 10

## 2020-05-25 MED ORDER — ENOXAPARIN SODIUM 30 MG/0.3ML ~~LOC~~ SOLN
30.0000 mg | SUBCUTANEOUS | Status: DC
  Administered 2020-05-26: 30 mg via SUBCUTANEOUS
  Filled 2020-05-25: qty 0.3

## 2020-05-25 MED ORDER — POLYETHYLENE GLYCOL 3350 17 G PO PACK
17.0000 g | PACK | Freq: Every day | ORAL | Status: DC
  Administered 2020-05-25: 17 g
  Filled 2020-05-25: qty 1

## 2020-05-25 MED ORDER — SODIUM CHLORIDE 0.9 % IV BOLUS
500.0000 mL | Freq: Once | INTRAVENOUS | Status: AC
  Administered 2020-05-25: 500 mL via INTRAVENOUS

## 2020-05-25 MED ORDER — METOPROLOL TARTRATE 5 MG/5ML IV SOLN
2.5000 mg | Freq: Four times a day (QID) | INTRAVENOUS | Status: DC
  Administered 2020-05-25: 2.5 mg via INTRAVENOUS

## 2020-05-25 MED ORDER — ALBUMIN HUMAN 5 % IV SOLN
250.0000 mL | INTRAVENOUS | Status: AC
  Administered 2020-05-25: 12.5 g via INTRAVENOUS
  Filled 2020-05-25: qty 250

## 2020-05-25 MED ORDER — VITAL AF 1.2 CAL PO LIQD
1000.0000 mL | ORAL | Status: DC
  Administered 2020-05-25: 1000 mL

## 2020-05-25 MED ORDER — ALBUMIN HUMAN 5 % IV SOLN
12.5000 g | Freq: Once | INTRAVENOUS | Status: DC
  Filled 2020-05-25: qty 250

## 2020-05-25 MED ORDER — DOCUSATE SODIUM 50 MG/5ML PO LIQD
100.0000 mg | Freq: Two times a day (BID) | ORAL | Status: DC
  Administered 2020-05-25 (×3): 100 mg
  Filled 2020-05-25 (×3): qty 10

## 2020-05-25 MED ORDER — PROSOURCE TF PO LIQD
45.0000 mL | Freq: Three times a day (TID) | ORAL | Status: DC
  Administered 2020-05-25 (×2): 45 mL

## 2020-05-25 MED ORDER — METOPROLOL TARTRATE 5 MG/5ML IV SOLN: 2.5000 mg | Freq: Once | INTRAVENOUS | Status: AC

## 2020-05-25 MED ORDER — LACTATED RINGERS IV BOLUS: 500.0000 mL | Freq: Once | INTRAVENOUS | Status: DC

## 2020-05-25 MED ORDER — DIAZEPAM 5 MG PO TABS
5.0000 mg | ORAL_TABLET | Freq: Three times a day (TID) | ORAL | Status: DC
  Administered 2020-05-25 – 2020-05-26 (×3): 5 mg
  Filled 2020-05-25 (×3): qty 1

## 2020-05-25 MED ORDER — VITAL HIGH PROTEIN PO LIQD: 1000.0000 mL | ORAL | Status: DC

## 2020-05-25 MED ORDER — STERILE WATER FOR INJECTION IJ SOLN: 50.0000 ng/kg/min | INTRAVENOUS | Status: DC

## 2020-05-25 MED ORDER — ALBUMIN HUMAN 5 % IV SOLN
500.0000 mL | INTRAVENOUS | Status: AC
  Administered 2020-05-25: 25 g via INTRAVENOUS
  Filled 2020-05-25: qty 500

## 2020-05-25 MED ORDER — METOPROLOL TARTRATE 5 MG/5ML IV SOLN
INTRAVENOUS | Status: AC
  Administered 2020-05-25: 2.5 mg via INTRAVENOUS
  Filled 2020-05-25: qty 5

## 2020-05-25 MED ORDER — NOREPINEPHRINE 4 MG/250ML-% IV SOLN
0.0000 ug/min | INTRAVENOUS | Status: DC
  Administered 2020-05-26: 16 ug/min via INTRAVENOUS
  Filled 2020-05-25: qty 250

## 2020-05-25 MED ORDER — MIDAZOLAM HCL 2 MG/2ML IJ SOLN
INTRAMUSCULAR | Status: AC
  Administered 2020-05-25: 2 mg via INTRAVENOUS
  Filled 2020-05-25: qty 2

## 2020-05-25 NOTE — Progress Notes (Signed)
Patient changed to heated wire circuit, at the request of the MD, to help eliminate dead space due to patients increased PCO2 on ABG.

## 2020-05-25 NOTE — Progress Notes (Signed)
Patients sats were in the mid 80's, oxygen increased to achieve sat of 90% or better. Sats 91% @ 100% FIO2.

## 2020-05-25 NOTE — Progress Notes (Signed)
Pt severely hypoxic despite FiO2 @100 % and PEEP of 10 O2 sats remain 73 to 75%.  Bag lavaged pt and able to suction copious amounts of thick tan secretions. Pt also noted to have elevated peak pressures, therefore propofol gtt restarted and pt received 10 mg of iv vecuronium x1 dose but remained hypoxic.  Will proceed with proning pt in an attempt to improve oxygenation.  Will continue to monitor and assess pt.   , AGNP  Pulmonary/Critical Care Pager [phone] (please enter 7 digits) PCCM Consult Pager [phone] (please enter 7 digits)

## 2020-05-25 NOTE — Progress Notes (Signed)
Pt taped 22cm at the lip center. Pt placed in prone position. Pt suctioned. Pt remains on the vent and is tol well at this time.

## 2020-05-25 NOTE — Progress Notes (Signed)
Follow up - Critical Care Medicine Note  Patient Details:    Janice Mayo is an 55 y.o. 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 Left ventricular ejection fraction, by estimation, is 70 to 75%. The  left ventricle has hyperdynamic function. The left ventricle has no  regional wall motion abnormalities. There is mild left ventricular  hypertrophy. Left ventricular diastolic  parameters were normal.  2. Right ventricular systolic function is normal. The right ventricular  size is mildly enlarged.  3. The mitral valve is grossly normal. Trivial mitral valve  regurgitation.  4. The aortic valve is grossly normal. Aortic valve regurgitation is not  visualized.   Plan:  Clinically intravascularly would appear dry despite overall body water Echo shows a hyperdynamic LV and plethoric RV US of neck for line placement with flat IJ's Volume with albumin, avoid crystals as possible Will trial inhaled Veletri to improve oxygenation and LV preloading   RENAL  AKI   Lab Results  Component Value Date   CREATININE 2.40 (H) 05/25/2020   BUN 23 (H) 05/25/2020   NA 130 (L) 05/25/2020   K 4.9 05/25/2020   CL 101 05/25/2020   CO2 21 (L) 05/25/2020    Intake/Output Summary (Last 24 hours) at 05/25/2020 1413 Last data filed at 05/25/2020 1200 Gross per  24 hour  Intake 4364.47 ml  Output 310 ml  Net 4054.47 ml  Net IO Since Admission: 6,698.37 mL [05/25/20 1413]  Plan: Cautious volume management Hyperdynamic LV this morning echo as noted Albumin 5% bolus given earlier, may repeat   GI  Stable   Plan:  Bowel protocol TF per nutrition  PPI for PUDp  ID  COVID Pneumonitis    Plan: Mycophenolate as outpatient, will hold at present   Vaccinated Post Paxlovid 5 days as outpatient  Single dose of baricitnib was given and discontinued Remdesivir was excluded Presently on solumedrol 60mg  IV q12h CRP <20   HEME  Hypercoagulable State, COVID-19 Anemia of Chronic Disease Dilutional Anemia LMWH daily, adequate full dosing with renal insufficieny    Plan:  Serial determination of H/H Trend D-dimer  ENDO Hyperglycemia   Plan:  CCM Glycemic Control  Global Issues  FULL CODE Requires ICU    LOS: 2 days    Lines, Airways, Drains: Airway 7.5 mm (Active)  Secured at (cm) 22 cm 05/24/20 1520  Measured From Lips 05/24/20 1520  Secured Location Left 05/24/20 1507  Secured By [employer] Tube Holder 05/24/20 1520  Tube Holder Repositioned Yes 05/24/20 1507  Prone position No 05/08/2020 2000  Cuff Pressure (cm H2O) 28 cm H2O 05/24/20 1507  Site Condition Dry 05/24/20 1520     NG/OG Tube Orogastric 16 Fr. Center mouth Xray Documented cm marking at nare/ corner of mouth 65 cm (Active)  Cm Marking at Nare/Corner of  Mouth (if applicable) 80 cm 05/24/20 1520  Site Assessment Clean;Dry;Intact 05/24/20 1520  Ongoing Placement Verification No change in cm markings or external length of tube from initial placement;No change in respiratory status;No acute changes, not attributed to clinical condition 05/24/20 1520  Status Irrigated;Clamped 05/24/20 1520  Intake (mL) 30 mL 05/24/20 1700  Output (mL) 100 mL 05/24/20 0900     Urethral Catheter zach rn  (Active)  Indication for Insertion or Continuance of Catheter Unstable critically ill  patients first 24-48 hours (See Criteria) 05/24/20 0722  Site Assessment Clean;Dry;Intact 05/24/20 0722  Catheter Maintenance Bag below level of bladder;Catheter secured;Drainage bag/tubing not touching floor;No dependent loops;Seal intact 05/24/20 0722  Collection Container Standard drainage bag 05/24/20 0722  Securement Method Securing device (Describe) 05/24/20 0722  Output (mL) 20 mL 05/24/20 1700    Anti-infectives:  Anti-infectives (From admission, onward)   None      Microbiology: Results for orders placed or performed during the hospital encounter of 05/30/20  SARS CORONAVIRUS 2 (TAT 6-24 HRS) Nasopharyngeal Nasopharyngeal Swab     Status: Abnormal   Collection Time: 2020-05-30  9:20 AM   Specimen: Nasopharyngeal Swab  Result Value Ref Range Status   SARS Coronavirus 2 POSITIVE (A) NEGATIVE Final    Comment: (NOTE) SARS-CoV-2 target nucleic acids are DETECTED.  The SARS-CoV-2 RNA is generally detectable in upper and lower respiratory specimens during the acute phase of infection. Positive results are indicative of the presence of SARS-CoV-2 RNA. Clinical correlation with patient history and other diagnostic information is  necessary to determine patient infection status. Positive results do not rule out bacterial infection or co-infection with other viruses.  The expected result is Negative.  Fact Sheet for Patients: HairSlick.no  Fact Sheet for Healthcare Providers: quierodirigir.com  This test is not yet approved or cleared by the [place] FDA and  has been authorized for detection and/or diagnosis of SARS-CoV-2 by FDA under an Emergency Use Authorization (EUA). This EUA will remain  in effect (meaning this test can be used) for the duration of the COVID-19 declaration under Section 564(b)(1) of the Act, 21 U. S.C. section 360bbb-3(b)(1), unless the authorization is terminated or revoked  sooner.   Performed at [facility], [address], [place], [place] [address]   MRSA PCR Screening     Status: None   Collection Time: 05/30/20  6:48 PM   Specimen: Nasopharyngeal  Result Value Ref Range Status   MRSA by PCR NEGATIVE NEGATIVE Final    Comment:        The GeneXpert MRSA Assay (FDA approved for NASAL specimens only), is one component of a comprehensive MRSA colonization surveillance program. It is not intended to diagnose MRSA infection nor to guide or monitor treatment for MRSA infections. Performed at [facility], [address] [place], [place] [address]   Culture, Respiratory w Gram Stain     Status: None (Preliminary result)   Collection Time: 05/30/20 11:31 PM   Specimen: Tracheal Aspirate; Respiratory  Result Value Ref Range Status   Specimen Description   Final    TRACHEAL ASPIRATE Performed at [facility], [address], [place], [place] [address]    Special Requests   Final    Immunocompromised Performed at [facility], [address] [place], [place] [address]    Gram Stain   Final    FEW WBC PRESENT, PREDOMINANTLY PMN FEW GRAM POSITIVE COCCI    Culture   Final    ABUNDANT  STAPHYLOCOCCUS AUREUS SUSCEPTIBILITIES TO FOLLOW MODERATE STREPTOCOCCUS AGALACTIAE TESTING AGAINST S. AGALACTIAE NOT ROUTINELY PERFORMED DUE TO PREDICTABILITY OF AMP/PEN/VAN SUSCEPTIBILITY. Performed at [facility], [address], [place], [place] [address]    Report Status PENDING  Incomplete    Events:   Studies: CT ANGIO CHEST PE W OR WO CONTRAST  Result Date: 05/31/20 CLINICAL DATA:  Fevers and cough EXAM: CT ANGIOGRAPHY CHEST WITH CONTRAST TECHNIQUE: Multidetector CT imaging of the chest was performed using the standard protocol during bolus administration of intravenous contrast. Multiplanar CT image reconstructions and MIPs were obtained to evaluate the vascular anatomy. CONTRAST:  52mL OMNIPAQUE IOHEXOL 350  MG/ML SOLN COMPARISON:  Radiographs 31-May-2020 FINDINGS: Cardiovascular: Satisfactory opacification of pulmonary arteries though images are markedly motion degraded which may limit detection of smaller segmental and subsegmental pulmonary artery emboli. No large central or lobar filling defects are identified. Few foci of gas within the pulmonary trunk likely related to intravenous access. Central pulmonary arteries are enlarged with an elevated PA to aorta ratio. Cardiomegaly. Trace pericardial effusion. The aortic root is suboptimally assessed given cardiac pulsation artifact. Atherosclerotic plaque within the normal caliber aorta. No acute luminal abnormality of the imaged aorta. No periaortic stranding or hemorrhage. Normal 3 vessel branching of the aortic arch. Proximal great vessels are unremarkable. Mediastinum/Nodes: No mediastinal fluid or gas. Normal thyroid gland and thoracic inlet. Endotracheal tube tip terminates in the mid trachea, 3 cm from the carina. Transesophageal tube in place coiling in the stomach with the tip near the gastric fundus and side port beyond the GE junction. Scattered low-attenuation, borderline enlarged mediastinal and hilar nodes are present for instance a 14 mm right hilar node (4/30), an 11 mm AP window lymph node (4/23), and a 14 mm subcarinal node (4/33). No worrisome axillary adenopathy. Lungs/Pleura: Extensive motion degradation limits evaluation of the lung parenchyma. Diffuse areas of mixed ground-glass opacity and basilar predominant consolidation throughout both lungs as well as interlobular septal thickening and fissural thickening. No pneumothorax. No sizable pleural effusion. Evaluation for underlying pulmonary nodules and masses is limited given the extent of parenchymal lung disease. Upper Abdomen: No acute abnormalities present in the visualized portions of the upper abdomen. Musculoskeletal: Multilevel degenerative changes are present in the imaged portions of  the spine. Schmorl's node formation L1. No worrisome chest wall masses or lesions. Review of the MIP images confirms the above findings. IMPRESSION: 1. No large central or lobar filling defects are identified. More distal evaluation limited by extensive respiratory motion. 2. Central pulmonary arterial enlargement, nonspecific though can be seen with pulmonary artery hypertension. 3. Diffuse areas of mixed ground-glass opacity and basilar predominant consolidation throughout both lungs as well as interlobular septal thickening and fissural thickening. Findings are favored to represent a combination of pulmonary edema and atypical infection. 4. Cardiomegaly with trace pericardial effusion. 5. Scattered borderline enlarged mediastinal and hilar nodes, nonspecific, but favored to be reactive and or edematous. 6. Satisfactory positioning of the transesophageal and endotracheal tubes. 7. Aortic Atherosclerosis (ICD10-I70.0). Electronically Signed   By: Kreg Shropshire M.D.   On: 31-May-2020 22:15   US RENAL  Result Date: 05/25/2020 CLINICAL DATA:  Acute kidney injury. EXAM: RENAL / URINARY TRACT ULTRASOUND COMPLETE COMPARISON:  None. FINDINGS: Right Kidney: Renal measurements: 10.9 x 4.7 x 5.0 cm = volume: 134 mL. No hydronephrosis. Echogenic right renal parenchyma, normal thickness (13 mm). No renal mass. Left Kidney: Renal measurements: 11.2 x 4.9 x 7.8 cm = volume: 226 mL. No hydronephrosis. Echogenic left  renal parenchyma, normal thickness (12 mm). Small simple 1.5 cm peripelvic upper left renal cyst. Bladder: Bladder collapsed, precluding assessment. Other: Small amount of pelvic ascites. IMPRESSION: 1. No hydronephrosis. 2. Echogenic normal size kidneys, compatible with reported nonspecific acute renal parenchymal disease. 3. Small simple upper left renal cyst. 4. Bladder collapsed, precluding assessment. 5. Small amount of pelvic ascites. Electronically Signed   By: Delbert Phenix M.D.   On: 05/25/2020 12:34   DG  Chest Port 1 View  Result Date: 05/25/2020 CLINICAL DATA:  Respiratory failure EXAM: PORTABLE CHEST 1 VIEW COMPARISON:  Radiograph 05/24/2020, CT 05/24/20 FINDINGS: Endotracheal tube tip terminates in the mid to lower trachea, 2.9 cm from the carina. Transesophageal tube tip and side port terminate below the margins of imaging, beyond the GE junction. Telemetry leads overlie the chest. Persistent bilateral heterogeneous opacities throughout the lungs on a background of atelectasis. Some increasingly coalescent density is present in the right lung base albeit with partial clearing in the left base. No pneumothorax. Suspect a layering right pleural effusion. No visible left effusion. Stable cardiomediastinal contours accounting for differences technique. No acute osseous or soft tissue abnormality. IMPRESSION: 1. Persistent bilateral heterogeneous opacities throughout the lungs on a background of atelectasis. Slightly shifting opacity with improvement in the left base though increasing density in the right lung base. 2. Stable hardware positioning. Electronically Signed   By: Kreg Shropshire M.D.   On: 05/25/2020 05:26   DG Chest Port 1 View  Result Date: 05/24/2020 CLINICAL DATA:  Acute respiratory failure EXAM: PORTABLE CHEST 1 VIEW COMPARISON:  Yesterday FINDINGS: The endotracheal tube tip is at the clavicular heads. The enteric tube is at the stomach. Confluent bilateral airspace disease with low lung volumes. Borderline heart size. No visible effusion or pneumothorax. IMPRESSION: Stable hardware positioning and confluent airspace disease. Electronically Signed   By: Marnee Spring M.D.   On: 05/24/2020 06:30   DG Chest Portable 1 View  Result Date: 05/24/20 CLINICAL DATA:  Status post intubation and OG tube placement. EXAM: PORTABLE CHEST 1 VIEW COMPARISON:  Single-view of the chest earlier today. FINDINGS: Endotracheal tube is in good position with the tip at the clavicular heads. OG tube is also in  good position with its tip in the fundus of the stomach. Extensive bilateral airspace disease persists without change. Cardiomegaly. IMPRESSION: ETT and OG tube in good position. No change in extensive bilateral airspace disease most compatible with pneumonia. Electronically Signed   By: Drusilla Kanner M.D.   On: 05-24-20 18:10   DG Chest Portable 1 View  Result Date: 05-24-20 CLINICAL DATA:  shortness of breath. EXAM: PORTABLE CHEST 1 VIEW COMPARISON:  None FINDINGS: Decreased lung volumes. Bilateral interstitial and airspace opacities are identified with a peripheral predominance. Small pleural effusions are suspected bilaterally. IMPRESSION: Bilateral interstitial and airspace opacities compatible with multifocal infection. Electronically Signed   By: Signa Kell M.D.   On: 05-24-2020 05:33   [place] EKG SITE RITE  Result Date: 05/25/2020 If [facility] image not attached, placement could not be confirmed due to current cardiac rhythm.   Consults:    Subjective:    Overnight Issues:   Objective:  Vital signs for last 24 hours: Temp:  [97.16 F (36.2 C)-99.32 F (37.4 C)] 97.2 F (36.2 C) (02/21 1200) Pulse Rate:  [103-126] 108 (02/21 1300) Resp:  [22-34] 30 (02/21 1200) BP: (77-106)/(48-63) 79/48 (02/21 1300) SpO2:  [87 %-99 %] 93 % (02/21 1300) FiO2 (%):  [50 %-100 %] 90 % (  02/21 1200)  Hemodynamic parameters for last 24 hours:    Intake/Output from previous day: 02/20 0701 - 02/21 0700 In: 4528.1 [I.V.:3279.3; NG/GT:270; IV Piggyback:978.9] Out: 525 [Urine:425; Emesis/NG output:100]  Intake/Output this shift: Total I/O In: 929.6 [I.V.:463.5; IV Piggyback:466.2] Out: 125 [Urine:5; Emesis/NG output:120]  Vent settings for last 24 hours: Vent Mode: PRVC FiO2 (%):  [50 %-100 %] 90 % Set Rate:  [30 bmp] 30 bmp Vt Set:  [380 mL] 380 mL PEEP:  [8 cmH20-10 cmH20] 8 cmH20 Plateau Pressure:  [24 cmH20-32 cmH20] 32 cmH20  Physical Exam:  Physical Exam Constitutional:       General: She is not in acute distress.    Appearance: She is ill-appearing.     Interventions: She is intubated.  HENT:     Head: Normocephalic and atraumatic.  Eyes:     Pupils: Pupils are equal, round, and reactive to light.  Cardiovascular:     Rate and Rhythm: Tachycardia present. Frequent extrasystoles are present.    Pulses: Decreased pulses.     Heart sounds: Normal heart sounds.  Pulmonary:     Effort: Tachypnea present. No accessory muscle usage or respiratory distress. She is intubated.     Breath sounds: Examination of the right-lower field reveals decreased breath sounds. Examination of the left-lower field reveals decreased breath sounds. Decreased breath sounds present. No wheezing, rhonchi or rales.     Comments: sedated Abdominal:     General: Abdomen is protuberant. Bowel sounds are decreased. There is no distension.     Palpations: Abdomen is soft.     Tenderness: There is no abdominal tenderness. There is no guarding or rebound.  Neurological:     Mental Status: She is unresponsive.     GCS: GCS eye subscore is 1. GCS motor subscore is 3.     Comments: Sedated       LOS: 2 days    Critical Care Time devoted to patient care services described in this note is 55 minutes.  Overall, patient is critically ill, prognosis is guarded.   Patient with Multiorgan failure and at high risk for cardiac arrest and death.  Images reviewed directly and interpretation in A&P is my own unless noted Labs reviewed and evaluated as noted in A/P Elyn Aquas 05/25/2020  *Care during the described time interval was provided by me and/or other providers on the critical care team.  I have reviewed this patient's available data, including medical history, events of note, physical examination and test results as part of my evaluation.

## 2020-05-25 NOTE — Significant Event (Signed)
Pt tachycardic and O2 sats in 60's to 70'S. Ventilator settiings changed by RT to 100% and 10 of PEEP with no improvement. Sonda Rumble to bedside and decision made to prone patient. Pt proned successfully at 2200.

## 2020-05-25 NOTE — Progress Notes (Signed)
Secure chat with primary Rn re: PICC placement order. Per RN, MD placing CVC, no need for PICC.

## 2020-05-25 NOTE — Progress Notes (Signed)
Updated pts sister Beau Fanny via telephone regarding decline in pts respiratory status and all questions were answered.  Will continue to monitor and assess pt.  Sonda Rumble, AGNP  Pulmonary/Critical Care Pager [phone] (please enter 7 digits) PCCM Consult Pager [phone] (please enter 7 digits)

## 2020-05-25 NOTE — Progress Notes (Signed)
*  PRELIMINARY RESULTS* Echocardiogram 2D Echocardiogram has been performed.  Cristela Blue 05/25/2020, 8:34 AM

## 2020-05-25 NOTE — Plan of Care (Signed)
Pt ST 130's on monitor, treated as ordered, pt UOP declining, provider aware, orders implemented to assist UOP see epic, Pt continuous to require pressor support for MAP and SBP goal. Family updated x 2 during shift, family requesting to speak with provider today, notified provider and oncoming RN of family request.  See epic for further questions. Handoff report given to oncoming RN. Relinquishing care.   Problem: Education: Goal: Knowledge of risk factors and measures for prevention of condition will improve Outcome: Progressing   Problem: Coping: Goal: Psychosocial and spiritual needs will be supported Outcome: Progressing   Problem: Respiratory: Goal: Will maintain a patent airway Outcome: Progressing Goal: Complications related to the disease process, condition or treatment will be avoided or minimized Outcome: Progressing

## 2020-05-25 NOTE — Progress Notes (Signed)
Initial Nutrition Assessment  DOCUMENTATION CODES:   Not applicable  INTERVENTION:  Initiate Vital AF 1.2 Cal at 15 mL/hr and advance by 15 mL/hr every 12 hours to goal rate of 45 mL/hr (1080 mL goal daily volume). Provide PROSource TF 45 mL TID per tube. Goal regimen provides 1416 kcal, 114 grams of protein, 875 mL H2O daily.  Continue MVI daily per tube.  Monitor magnesium, potassium, and phosphorus daily for at least 3 days, MD to replete as needed, as pt is at risk for refeeding syndrome.  NUTRITION DIAGNOSIS:   Inadequate oral intake related to inability to eat as evidenced by NPO status.  GOAL:   Patient will meet greater than or equal to 90% of their needs  MONITOR:   Vent status,Labs,Weight trends,TF tolerance,I & O's  REASON FOR ASSESSMENT:   Ventilator,Consult Enteral/tube feeding initiation and management  ASSESSMENT:   55 year old female with PMHx of systemic sclerosis with associated ILD and pulmonary hypertension [facility], Raynaud's disease, osteoporosis, OCD, EtOH abuse admitted with COVID-19 PNA/ARDS, bacterial PNA, ILD flare.   2/19 intubated  Patient is currently intubated on ventilator support MV: 11.2 L/min Temp (24hrs), Avg:98.1 F (36.7 C), Min:96.98 F (36.1 C), Max:99.32 F (37.4 C)  Medications reviewed and include: vitamin C 500 mg daily per tube, vitamin D3 1000 units daily per tube, Colace 100 mg Bid per tube, folic acid 1 mg daily per tube, Solu-Medrol 60 g Q12hrs IV, MVI daily per tube, Protonix, Miralax, zinc sulfate 220 mg daily per tube, fentanyl gtt, LR at 100 mL/hr, norepinephrine gtt at 10 mcg/min, propofol gtt was stopped this AM, thiamine 500 mg Q24hrs IV, vasopressin gtt 0.03 units/min.  Labs reviewed: Sodium 130, CO2 21, BUN 23, Creatinine 2.4, Phosphorus 5.9.  I/O: 425 mL UOP yesterday (0.2 mL/kg/hr); 100 mL output from OGT yesterday  Enteral Access: 16 Fr. OGT placed 2/19; terminates in stomach per chest x-ray  2/20  Discussed with RN and on rounds. Propofol gtt was stopped this AM. Plan is to start tube feeds today.  NUTRITION - FOCUSED PHYSICAL EXAM:  Flowsheet Row Most Recent Value  Orbital Region No depletion  Upper Arm Region No depletion  Thoracic and Lumbar Region No depletion  Buccal Region Unable to assess  Temple Region No depletion  Clavicle Bone Region No depletion  Clavicle and Acromion Bone Region Mild depletion  Scapular Bone Region Unable to assess  Dorsal Hand No depletion  Patellar Region No depletion  Anterior Thigh Region No depletion  Posterior Calf Region No depletion  Edema (RD Assessment) None  Hair Reviewed  Eyes Unable to assess  Mouth Unable to assess  Skin Reviewed  Nails Reviewed     Diet Order:   Diet Order            Diet NPO time specified  Diet effective now                EDUCATION NEEDS:   No education needs have been identified at this time  Skin:  Skin Assessment: Reviewed RN Assessment  Last BM:  05/22/2020 per chart  Height:   Ht Readings from Last 1 Encounters:  06-18-20 5\' 3"  (1.6 m)   Weight:   Wt Readings from Last 1 Encounters:  2020/06/18 75 kg   Ideal Body Weight:  52.3 kg  BMI:  Body mass index is 29.29 kg/m.  Estimated Nutritional Needs:   Kcal:  1440  Protein:  112-122 grams  Fluid:  >/= 2 L/day  Jacklynn Barnacle, MS, RD, LDN Pager number available on Amion

## 2020-05-26 ENCOUNTER — Inpatient Hospital Stay: Payer: 59

## 2020-05-26 LAB — BLOOD GAS, ARTERIAL
Acid-base deficit: 5.2 mmol/L — ABNORMAL HIGH (ref 0.0–2.0)
Bicarbonate: 22.5 mmol/L (ref 20.0–28.0)
FIO2: 1
MECHVT: 380 mL
Mechanical Rate: 34
O2 Saturation: 66 %
PEEP: 10 cmH2O
Patient temperature: 37
pCO2 arterial: 55 mmHg — ABNORMAL HIGH (ref 32.0–48.0)
pH, Arterial: 7.22 — ABNORMAL LOW (ref 7.350–7.450)
pO2, Arterial: 42 mmHg — ABNORMAL LOW (ref 83.0–108.0)

## 2020-05-26 LAB — COMPREHENSIVE METABOLIC PANEL
ALT: 8 U/L (ref 0–44)
AST: 43 U/L — ABNORMAL HIGH (ref 15–41)
Albumin: 2.6 g/dL — ABNORMAL LOW (ref 3.5–5.0)
Alkaline Phosphatase: 76 U/L (ref 38–126)
Anion gap: 10 (ref 5–15)
BUN: 32 mg/dL — ABNORMAL HIGH (ref 6–20)
CO2: 23 mmol/L (ref 22–32)
Calcium: 7.1 mg/dL — ABNORMAL LOW (ref 8.9–10.3)
Chloride: 99 mmol/L (ref 98–111)
Creatinine, Ser: 3.89 mg/dL — ABNORMAL HIGH (ref 0.44–1.00)
GFR, Estimated: 13 mL/min — ABNORMAL LOW (ref >60)
Glucose, Bld: 111 mg/dL — ABNORMAL HIGH (ref 70–99)
Potassium: 4 mmol/L (ref 3.5–5.1)
Sodium: 132 mmol/L — ABNORMAL LOW (ref 135–145)
Total Bilirubin: 1.6 mg/dL — ABNORMAL HIGH (ref 0.3–1.2)
Total Protein: 5.6 g/dL — ABNORMAL LOW (ref 6.5–8.1)

## 2020-05-26 LAB — CBC WITH DIFFERENTIAL/PLATELET
Abs Immature Granulocytes: 0.1 10*3/uL — ABNORMAL HIGH (ref 0.00–0.07)
Basophils Absolute: 0 10*3/uL (ref 0.0–0.1)
Basophils Relative: 0 %
Eosinophils Absolute: 0 10*3/uL (ref 0.0–0.5)
Eosinophils Relative: 0 %
HCT: 28 % — ABNORMAL LOW (ref 36.0–46.0)
Hemoglobin: 8.5 g/dL — ABNORMAL LOW (ref 12.0–15.0)
Immature Granulocytes: 2 %
Lymphocytes Relative: 2 %
Lymphs Abs: 0.1 10*3/uL — ABNORMAL LOW (ref 0.7–4.0)
MCH: 29.8 pg (ref 26.0–34.0)
MCHC: 30.4 g/dL (ref 30.0–36.0)
MCV: 98.2 fL (ref 80.0–100.0)
Monocytes Absolute: 0.2 10*3/uL (ref 0.1–1.0)
Monocytes Relative: 3 %
Neutro Abs: 5.8 10*3/uL (ref 1.7–7.7)
Neutrophils Relative %: 93 %
Platelets: 159 10*3/uL (ref 150–400)
RBC: 2.85 MIL/uL — ABNORMAL LOW (ref 3.87–5.11)
RDW: 12.9 % (ref 11.5–15.5)
Smear Review: NORMAL
WBC: 6.2 10*3/uL (ref 4.0–10.5)
nRBC: 0.5 % — ABNORMAL HIGH (ref 0.0–0.2)

## 2020-05-26 LAB — MAGNESIUM: Magnesium: 1.4 mg/dL — ABNORMAL LOW (ref 1.7–2.4)

## 2020-05-26 LAB — GLUCOSE, CAPILLARY
Glucose-Capillary: 97 mg/dL (ref 70–99)
Glucose-Capillary: 75 mg/dL (ref 70–99)

## 2020-05-26 LAB — PHOSPHORUS: Phosphorus: 3.5 mg/dL (ref 2.5–4.6)

## 2020-05-26 LAB — CULTURE, RESPIRATORY W GRAM STAIN

## 2020-05-26 LAB — FERRITIN: Ferritin: 209 ng/mL (ref 11–307)

## 2020-05-26 LAB — PROCALCITONIN: Procalcitonin: 42.72 ng/mL

## 2020-05-26 LAB — C-REACTIVE PROTEIN: CRP: 20.2 mg/dL — ABNORMAL HIGH (ref <1.0)

## 2020-05-26 MED ORDER — SODIUM BICARBONATE 8.4 % IV SOLN: 50.0000 meq | Freq: Once | INTRAVENOUS | Status: AC

## 2020-05-26 MED ORDER — PHENYLEPHRINE CONCENTRATED 100MG/250ML (0.4 MG/ML) INFUSION SIMPLE
0.0000 ug/min | INTRAVENOUS | Status: DC
  Administered 2020-05-26: 20 ug/min via INTRAVENOUS
  Filled 2020-05-26: qty 250

## 2020-05-26 MED ORDER — DEXTROSE 10 % IV SOLN: INTRAVENOUS | Status: DC

## 2020-05-26 MED ORDER — NOREPINEPHRINE 16 MG/250ML-% IV SOLN
0.0000 ug/min | INTRAVENOUS | Status: DC
  Administered 2020-05-26: 18 ug/min via INTRAVENOUS
  Filled 2020-05-26: qty 250

## 2020-05-26 MED ORDER — MIDAZOLAM HCL 2 MG/2ML IJ SOLN
1.0000 mg | INTRAMUSCULAR | Status: DC | PRN
  Administered 2020-05-26: 2 mg via INTRAVENOUS
  Filled 2020-05-26: qty 2

## 2020-05-26 MED ORDER — SODIUM CHLORIDE 0.9 % IV SOLN
2.0000 g | INTRAVENOUS | Status: DC
  Administered 2020-05-26: 2 g via INTRAVENOUS
  Filled 2020-05-26: qty 2

## 2020-05-26 MED ORDER — MAGNESIUM SULFATE 4 GM/100ML IV SOLN
4.0000 g | Freq: Once | INTRAVENOUS | Status: AC
  Administered 2020-05-26: 4 g via INTRAVENOUS
  Filled 2020-05-26: qty 100

## 2020-05-26 MED ORDER — HYDROMORPHONE HCL 1 MG/ML IJ SOLN: 2.0000 mg | INTRAMUSCULAR | Status: DC | PRN

## 2020-05-26 MED ORDER — SODIUM BICARBONATE 8.4 % IV SOLN
INTRAVENOUS | Status: AC
  Administered 2020-05-26: 50 meq via INTRAVENOUS
  Filled 2020-05-26: qty 50

## 2020-05-27 LAB — MYCOPHENOLIC ACID (CELLCEPT)
MPA Glucuronide: 11 ug/mL — ABNORMAL LOW (ref 15–125)
MPA: 0.3 ug/mL — ABNORMAL LOW (ref 1.0–3.5)

## 2020-05-28 LAB — FUNGITELL, SERUM: Fungitell Result: 39 pg/mL (ref <80)

## 2020-06-01 LAB — GLUCOSE, CAPILLARY: Glucose-Capillary: <10 mg/dL — CL (ref 70–99)

## 2020-06-02 NOTE — Progress Notes (Signed)
Pharmacy Antibiotic Note  Janice Mayo is a 55 y.o. female admitted on 06-20-20 with HCAP.  Pharmacy has been consulted for Cefepime dosing.  2/21 0506 SCr 2.4 2/22 0411 SCr 16.3, CrCl 16.3   Plan: Cefepime 2gm q24h based on indication and CrCl.  Height: 5\' 3"  (160 cm) Weight: 76 kg (167 lb 8.8 oz) IBW/kg (Calculated) : 52.4  Temp (24hrs), Avg:99.3 F (37.4 C), Min:97.2 F (36.2 C), Max:101.3 F (38.5 C)  Recent Labs  Lab 06/20/2020 0500 05/24/20 0423 05/25/20 0506 05/11/2020 0411  WBC 14.8* 22.6* 18.1* 6.2  CREATININE 0.64 0.88 2.40*  --     Estimated Creatinine Clearance: 26.4 mL/min (A) (by C-G formula based on SCr of 2.4 mg/dL (H)).    Allergies  Allergen Reactions  . Sulfasalazine Hives  . Erythromycin Hives  . Hydroxychloroquine Hives  . Hydroxychloroquine Sulfate   . Septra [Sulfamethoxazole-Trimethoprim] Hives  . Sulfa Antibiotics Hives  . Tetracyclines & Related Hives  . Trimethoprim Other (See Comments)  . Simvastatin Rash    Antimicrobials this admission: 05/11/2020 Cefepime >>   Microbiology results: 02/19 Resp Cx:  Staph Aureus & Strep Agalactiae present.  Staph susceptibilities pending.  Thank you for allowing pharmacy to be a part of this patient's care.  3/19, PharmD, [facility] 05/07/2020 5:01 AM

## 2020-06-02 NOTE — Progress Notes (Signed)
Family expressed wishes to proceed with comfort care. Dr. Earlie Server ordered for extubation to room air and comfort measures in place. Pt's family in waiting room.

## 2020-06-02 NOTE — Plan of Care (Signed)
PMT note:   Consult noted for GOC. Patient has already died. No family present at this time.   No charge note.

## 2020-06-02 NOTE — Discharge Summary (Signed)
Physician Discharge Summary  Patient ID: Janice Mayo MRN: [id number] DOB/AGE: 54-Oct-1967 54 y.o.  Admit date: 06-21-20 Discharge/Date of Death: 2020-06-24  Admission Diagnoses: Acute Hypoxic Respiratory Failure, COVID-19  Discharge Diagnoses:  Principal Problem:   Acute on chronic respiratory failure with hypoxia (HCC) Active Problems:   Other specified hypothyroidism   Raynaud's syndrome   Lung disease with systemic sclerosis (HCC)   Hyponatremia   Hypokalemia   Pneumonia due to COVID-19 virus   Essential hypertension   Respiratory failure, acute-on-chronic (HCC)   Discharged Condition: deceased  Chief Complaint: Shortness of breath  HPI:  "Janice Mayo is a 55 y.o. female with medical history significant for scleroderma, Raynaud's disease, OCD, chronic respiratory failure on 2 to 3 L of oxygen and recent COVID-19 positive test (home test ) on 05/15/20 who presents to the ER via EMS for evaluation of worsening shortness of breath.  Patient states that at baseline she is short of breath and wears 2 L of oxygen.  Since she tested positive for the COVID-19 virus she has had worsening shortness of breath and has had to increase her oxygen to 3 L continuous.  She has a cough productive of clear phlegm associated with fever, chills, myalgia, nausea, diarrhea and poor oral intake. Patient's pulse oximetry on 3 L of oxygen was 50% and patient was placed on CPAP with PEEP of 12 and her pulse oximetry improved to 94%. She was transitioned to BiPAP (12/6 100%) in the emergency room. Patient is fully vaccinated against the COVID-19 virus and also received her influenza vaccine.  Patient was placed on 5 days of oral antiviral against the COVID-19 virus, Paxlovid by her pulmonologist and has completed the course. She denies having any chest pain, no palpitations, no diaphoresis, no abdominal pain, no headache, no urinary frequency, no nocturia, no dysuria, no blurred vision, no focal  deficits. Venous blood gas 7.34/34/51/18.3/83.2 Labs show sodium 128, potassium 3.0, chloride 95, bicarb 20, glucose 118, BUN 8, creatinine 0.64, calcium 8.2, AST 30, ALT 7, total protein 7.4, troponin VI, procalcitonin 0.1, white count 14.8, hemoglobin 11.3, hematocrit 34.1, MCV 91.9, RDW 12.9, platelet count 285 Chest x-ray reviewed by me shows bilateral interstitial and airspace opacities compatible with multifocal infection. Twelve-lead EKG reviewed by me shows sinus tachycardia with ST depression in the lateral leads  ED Course: Patient is a 55 year old Caucasian female who presents to the ER via EMS for evaluation of worsening shortness of breath.  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  On 3 L of oxygen she had a pulse oximetry of 50% and was placed on CPAP by EMS with improvement in her pulse oximetry.  She was transitioned to BiPAP in the emergency room.  Patient had a positive home Covid test about 9 days prior to her admission and was treated as an outpatient with a 5-day course of Paxlovid.  Chest x-ray shows multifocal pneumonia.  She will be admitted to the hospital for further evaluation."  Janice Agbata MD [facility] Course:  Admitted to ICU and required intubation after failure of NIV. Patient did poorly with mechanical ventilation no doubt as a consequence of culmination of chronic interstitial lung disease, pulmonary hypertension, and now acutely with COVID. The patient could not be oxygenated even in the prone position. Family attended the bedside and wished for her to be made comfort measures. She was compassionately extubated and pressors held after being  supined. The patient passed away within only minutes of extubation. Family assembled.   Disposition:  Per family wishes    Signed: Elyn Aquas 05/27/2020, 12:32 PM

## 2020-06-02 NOTE — Progress Notes (Signed)
  Chaplain On-Call received pager notification that the patient has died, and family members are in the Waiting Room.  Chaplain received information about the patient from [place] and [employer].  Met with eleven family members and provided spiritual and emotional support, and opportunities for them to share special memories about the patient.  Chaplain provided prayer for the family in the Waiting Room and also at the bedside.  [place] Sayler Mickiewicz M.Div., [facility]

## 2020-06-02 NOTE — Progress Notes (Signed)
Pt extubated to comfort care.  

## 2020-06-02 NOTE — Progress Notes (Signed)
PCCM   The patient's family wishes that she be made comfort measures Will extubate to room air and discontinue pressors Maintain narcotic, discontinue propofol and versed  //Ellen Goris

## 2020-06-02 DEATH — deceased

## 2020-06-03 LAB — BLOOD GAS, VENOUS
Acid-base deficit: 6.7 mmol/L — ABNORMAL HIGH (ref 0.0–2.0)
Bicarbonate: 18.3 mmol/L — ABNORMAL LOW (ref 20.0–28.0)
O2 Saturation: 83.2 %
Patient temperature: 37
pCO2, Ven: 34 mmHg — ABNORMAL LOW (ref 44.0–60.0)
pH, Ven: 7.34 (ref 7.250–7.430)
pO2, Ven: 51 mmHg — ABNORMAL HIGH (ref 32.0–45.0)

## 2022-02-14 IMAGING — DX DG CHEST 1V PORT
1 series · 1 of 1 positions shown · non-contrast
Comparison: Single-view of the chest earlier today.

CLINICAL DATA: Status post intubation and OG tube placement.

EXAM:
PORTABLE CHEST 1 VIEW

[chest ap]
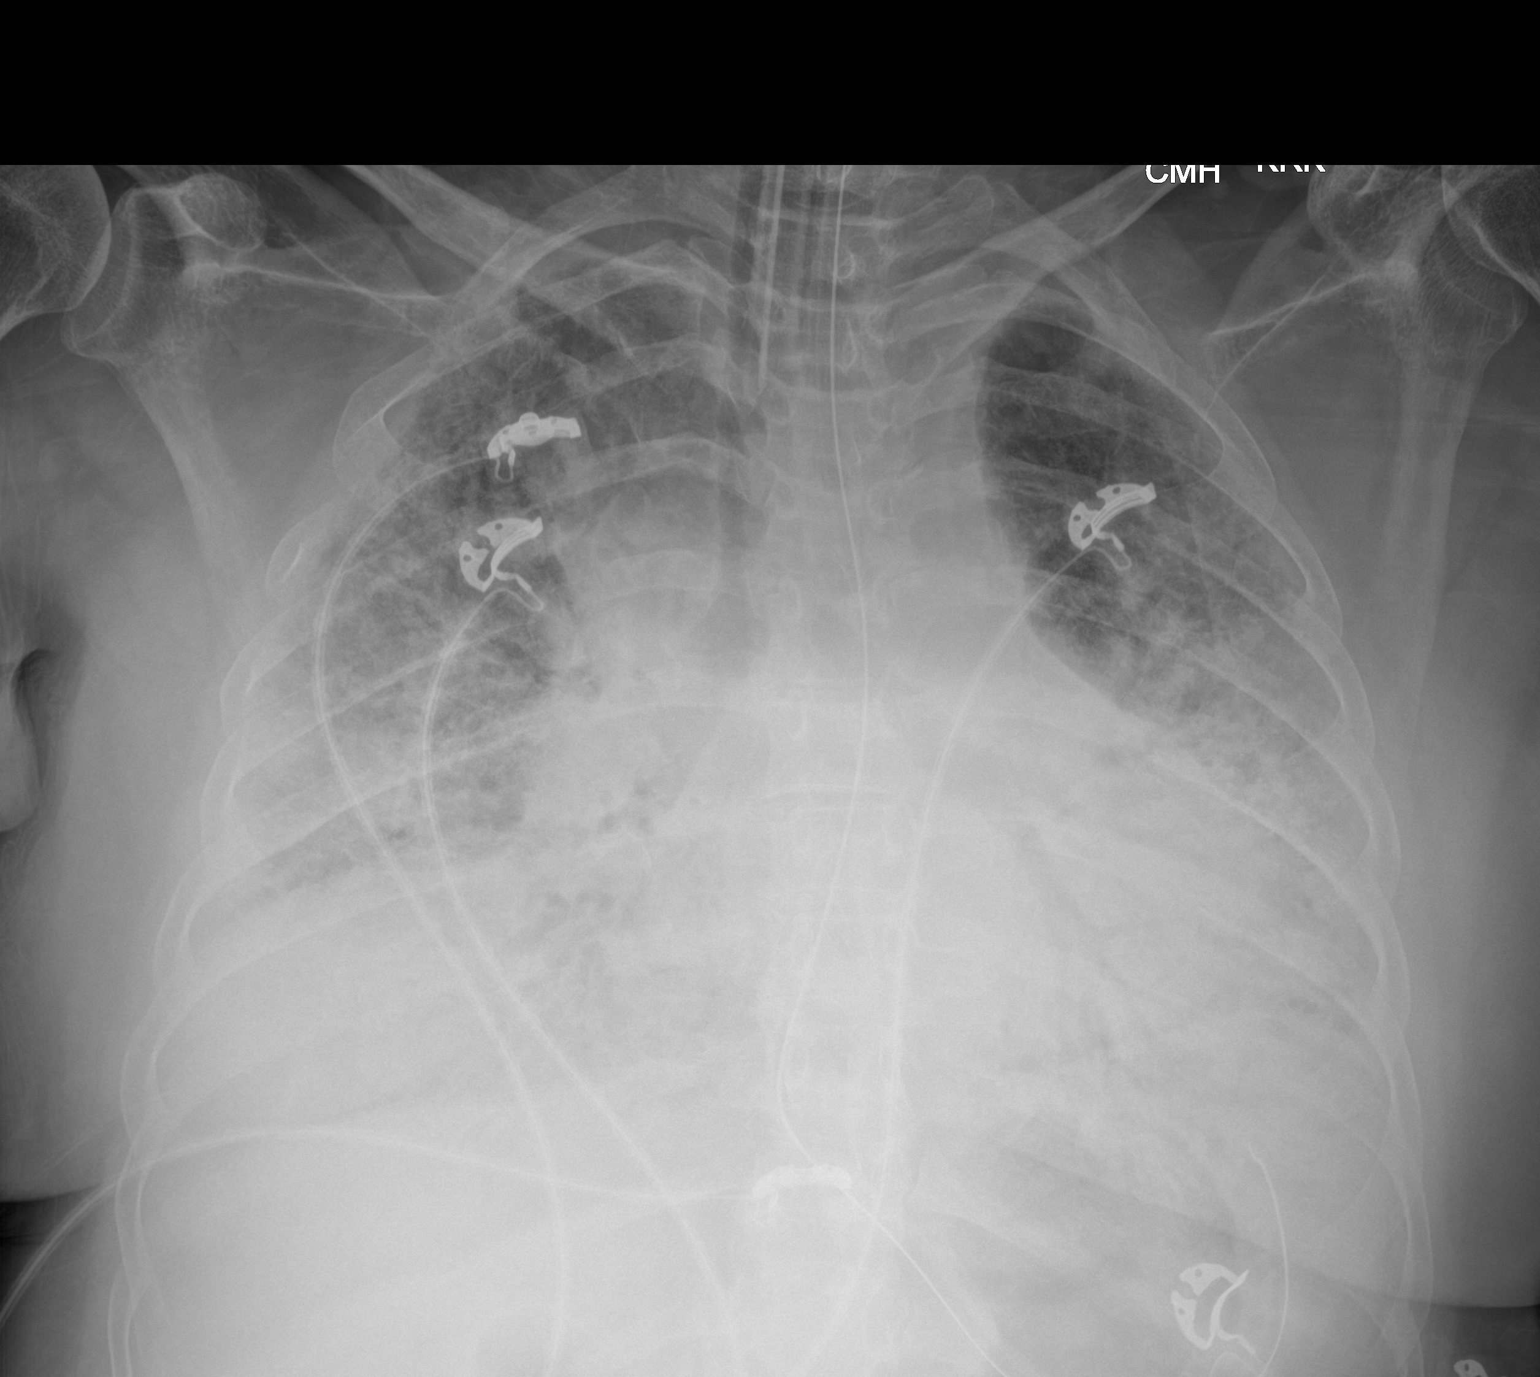

[1 of 1 positions shown; findings below may reference images not displayed]

FINDINGS: Endotracheal tube is in good position with the tip at the clavicular
heads. OG tube is also in good position with its tip in the fundus
of the stomach. Extensive bilateral airspace disease persists
without change. Cardiomegaly.
IMPRESSION: ETT and OG tube in good position.

No change in extensive bilateral airspace disease most compatible
with pneumonia.

## 2022-02-16 IMAGING — DX DG CHEST 1V PORT
1 series · 1 of 1 positions shown · non-contrast
Comparison: Same day chest radiograph at 3222 hours.

CLINICAL DATA: Central line placement.

EXAM:
PORTABLE CHEST 1 VIEW

[chest ap]
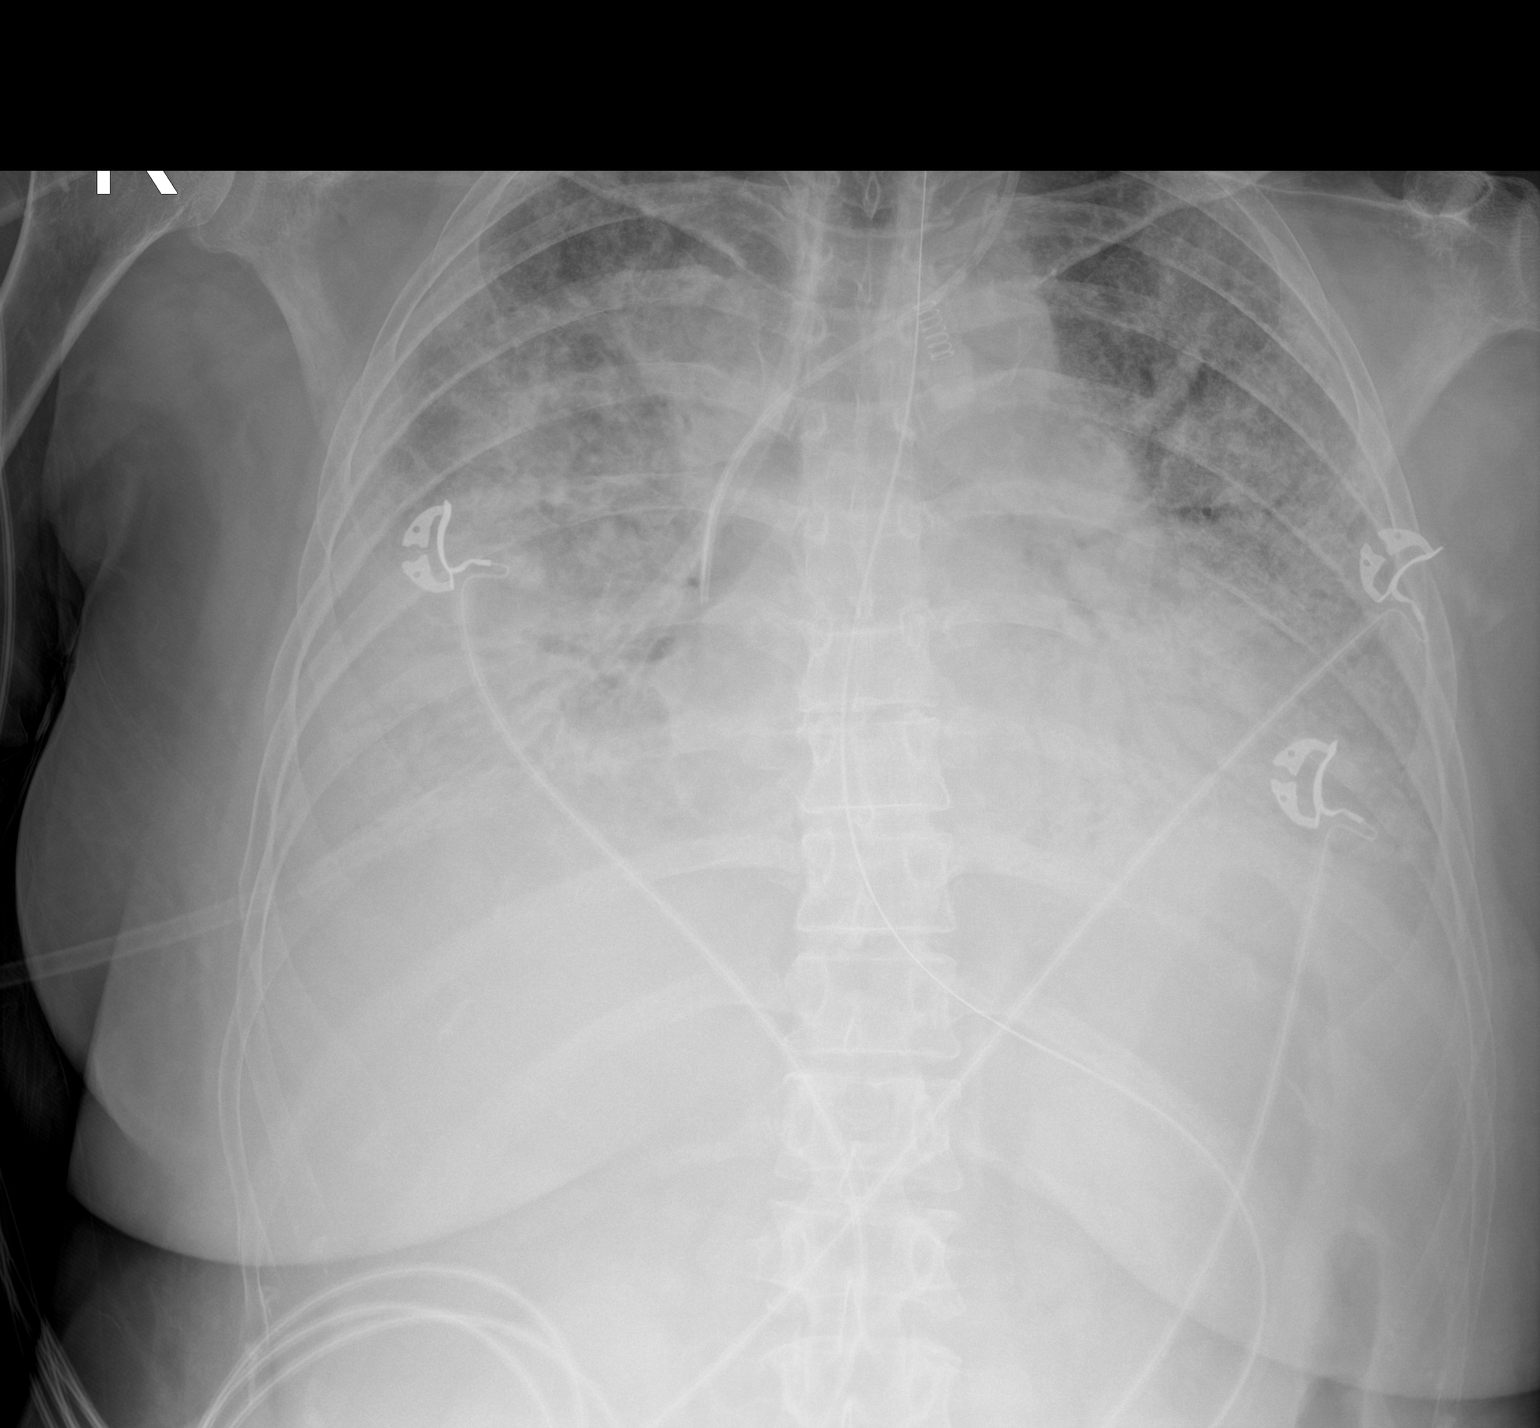

[1 of 1 positions shown; findings below may reference images not displayed]

FINDINGS: New left IJ central venous catheter with tip overlying the superior
cavoatrial junction. Endotracheal tube with tip projecting over the
superior thoracic trachea approximately 6 cm from the carina.
Nasogastric tube coursing below the diaphragm with tip obscured by
collimation. The heart size and mediastinal contours are largely
obscured but appear unchanged. Increased bilateral airspace
opacities with confluent opacities in the lung bases. No visible
pneumothorax. The visualized skeletal structures are unchanged.
IMPRESSION: 1. New left IJ central venous catheter with tip overlying the
superior cavoatrial junction, no visible pneumothorax.
2. Increased bilateral airspace opacities with confluent opacities
in the lung bases.
3. Endotracheal tube with tip projecting over the superior thoracic
trachea approximately 6 cm from the carina.
